# Patient Record
Sex: Female | Born: 1980
Health system: Southern US, Community
[De-identification: ages and names within clinical notes are randomized; demographics above are authoritative.]

## PROBLEM LIST (undated history)

## (undated) DIAGNOSIS — IMO0002 Reserved for concepts with insufficient information to code with codable children: Secondary | ICD-10-CM

## (undated) DIAGNOSIS — G43909 Migraine, unspecified, not intractable, without status migrainosus: Secondary | ICD-10-CM

## (undated) DIAGNOSIS — R87619 Unspecified abnormal cytological findings in specimens from cervix uteri: Secondary | ICD-10-CM

## (undated) HISTORY — DX: Migraine, unspecified, not intractable, without status migrainosus: G43.909

## (undated) HISTORY — DX: Reserved for concepts with insufficient information to code with codable children: IMO0002

## (undated) HISTORY — PX: KNEE SURGERY: SHX244

## (undated) HISTORY — DX: Unspecified abnormal cytological findings in specimens from cervix uteri: R87.619

---

## 2003-12-22 HISTORY — PX: COLPOSCOPY: SHX161

## 2005-05-03 ENCOUNTER — Emergency Department (HOSPITAL_COMMUNITY): Admission: EM | Admit: 2005-05-03 | Discharge: 2005-05-03 | Payer: Self-pay | Admitting: Emergency Medicine

## 2007-03-30 ENCOUNTER — Other Ambulatory Visit: Admission: RE | Admit: 2007-03-30 | Discharge: 2007-03-30 | Payer: Self-pay | Admitting: Obstetrics & Gynecology

## 2008-06-27 ENCOUNTER — Other Ambulatory Visit: Admission: RE | Admit: 2008-06-27 | Discharge: 2008-06-27 | Payer: Self-pay | Admitting: Obstetrics and Gynecology

## 2013-08-02 ENCOUNTER — Encounter: Payer: Self-pay | Admitting: Certified Nurse Midwife

## 2013-08-03 ENCOUNTER — Ambulatory Visit (INDEPENDENT_AMBULATORY_CARE_PROVIDER_SITE_OTHER): Payer: BC Managed Care – PPO | Admitting: Certified Nurse Midwife

## 2013-08-03 ENCOUNTER — Encounter: Payer: Self-pay | Admitting: Certified Nurse Midwife

## 2013-08-03 VITALS — BP 100/64 | HR 60 | Resp 16 | Ht 64.5 in | Wt 139.0 lb

## 2013-08-03 DIAGNOSIS — Z0000 Encounter for general adult medical examination without abnormal findings: Secondary | ICD-10-CM

## 2013-08-03 DIAGNOSIS — Z01419 Encounter for gynecological examination (general) (routine) without abnormal findings: Secondary | ICD-10-CM

## 2013-08-03 LAB — POCT URINALYSIS DIPSTICK
Blood, UA: NEGATIVE
Glucose, UA: NEGATIVE
Ketones, UA: NEGATIVE
Protein, UA: NEGATIVE
Urobilinogen, UA: NEGATIVE

## 2013-08-03 NOTE — Progress Notes (Signed)
Note reviewed, agree with plan.  Averie Hornbaker, MD  

## 2013-08-03 NOTE — Progress Notes (Signed)
32 y.o. [id number] Single Caucasian Fe here for annual exam. Periods normal. Contraception partner had vasectomy. No STD concerns or testing needed. Good year. Daughters now 7 and 9! No health issues today.  Patient's last menstrual period was 07/17/2013.          Sexually active: yes  The current method of family planning is vasectomy.    Exercising: yes  cardio & strength training Smoker:  no  Health Maintenance: Pap:  12/25/11 neg HPV HR neg MMG:  2/4/9 normal Colonoscopy:  none BMD:   none TDaP: 2010 Labs: Poct urine-neg, hgb-13.6 Self breast exam: done occ Abn. Pap 2004 with Colpo HPV + neg. colpo in 2005   reports that she has never smoked. She does not have any smokeless tobacco history on file. She reports that she drinks about 1.0 ounces of alcohol per week. She reports that she does not use illicit drugs.  Past Medical History  Diagnosis Date  . Abnormal Pap smear     ASCUS +HPV  . Migraines     Past Surgical History  Procedure Laterality Date  . Colposcopy  2005    neg  . Knee surgery Left     Current Outpatient Prescriptions  Medication Sig Dispense Refill  . EVENING PRIMROSE OIL PO Take by mouth daily. With omega 3      . Multiple Vitamins-Minerals (MULTIVITAMIN PO) Take by mouth daily.      Marland Kitchen UNABLE TO FIND daily. Med Name: 5HTP       No current facility-administered medications for this visit.    Family History  Problem Relation Age of Onset  . Cancer Mother     cervical  . Hyperlipidemia Mother   . Stroke Father   . Other Father     pulmonary fibrosis    ROS:  Pertinent items are noted in HPI.  Otherwise, a comprehensive ROS was negative.  Exam:   BP 100/64  Pulse 60  Resp 16  Ht 5' 4.5" (1.638 m)  Wt 139 lb (63.05 kg)  BMI 23.5 kg/m2  LMP 07/17/2013 Height: 5' 4.5" (163.8 cm)  Ht Readings from Last 3 Encounters:  08/03/13 5' 4.5" (1.638 m)    General appearance: alert, cooperative and appears stated age Head: Normocephalic, without  obvious abnormality, atraumatic Neck: no adenopathy, supple, symmetrical, trachea midline and thyroid normal to inspection and palpation Lungs: clear to auscultation bilaterally Breasts: normal appearance, no masses or tenderness, No nipple retraction or dimpling, No nipple discharge or bleeding, No axillary or supraclavicular adenopathy Heart: regular rate and rhythm Abdomen: soft, non-tender; no masses,  no organomegaly Extremities: extremities normal, atraumatic, no cyanosis or edema Skin: Skin color, texture, turgor normal. No rashes or lesions Lymph nodes: Cervical, supraclavicular, and axillary nodes normal. No abnormal inguinal nodes palpated Neurologic: Grossly normal   Pelvic: External genitalia:  no lesions              Urethra:  normal appearing urethra with no masses, tenderness or lesions              Bartholin's and Skene's: normal                 Vagina: normal appearing vagina with normal color and discharge, no lesions              Cervix: normal,non tender              Pap taken: yes Bimanual Exam:  Uterus:  normal size, contour, position, consistency, mobility, non-tender  and anteverted              Adnexa: normal adnexa and no mass, fullness, tenderness               Rectovaginal: Confirms               Anus:  normal sphincter tone, no lesions  A:  Well Woman with normal exam  Contraception partner vasectomy  History of abnormal pap( unsure date) with colposcopy just follow up pap? ( chart available today)  P:   Reviewed health and wellness pertinent to exam  Pap smear as per guidelines   pap smear taken today   counseled on breast self exam, adequate intake of calcium and vitamin D, diet and exercise  return annually or prn  An After Visit Summary was printed and given to the patient.

## 2013-08-03 NOTE — Patient Instructions (Signed)
General topics  Next pap or exam is  due in 1 year Take a Women's multivitamin Take 1200 mg. of calcium daily - prefer dietary If any concerns in interim to call back  Breast Self-Awareness Practicing breast self-awareness may pick up problems early, prevent significant medical complications, and possibly save your life. By practicing breast self-awareness, you can become familiar with how your breasts look and feel and if your breasts are changing. This allows you to notice changes early. It can also offer you some reassurance that your breast health is good. One way to learn what is normal for your breasts and whether your breasts are changing is to do a breast self-exam. If you find a lump or something that was not present in the past, it is best to contact your caregiver right away. Other findings that should be evaluated by your caregiver include nipple discharge, especially if it is bloody; skin changes or reddening; areas where the skin seems to be pulled in (retracted); or new lumps and bumps. Breast pain is seldom associated with cancer (malignancy), but should also be evaluated by a caregiver. BREAST SELF-EXAM The best time to examine your breasts is 5 7 days after your menstrual period is over.  ExitCare Patient Information 2013 [employer].   Exercise to Stay Healthy Exercise helps you become and stay healthy. EXERCISE IDEAS AND TIPS Choose exercises that:  You enjoy.  Fit into your day. You do not need to exercise really hard to be healthy. You can do exercises at a slow or medium level and stay healthy. You can:  Stretch before and after working out.  Try yoga, Pilates, or tai chi.  Lift weights.  Walk fast, swim, jog, run, climb stairs, bicycle, dance, or rollerskate.  Take aerobic classes. Exercises that burn about 150 calories:  Running 1  miles in 15 minutes.  Playing volleyball for 45 to 60 minutes.  Washing and waxing a car for 45 to 60  minutes.  Playing touch football for 45 minutes.  Walking 1  miles in 35 minutes.  Pushing a stroller 1  miles in 30 minutes.  Playing basketball for 30 minutes.  Raking leaves for 30 minutes.  Bicycling 5 miles in 30 minutes.  Walking 2 miles in 30 minutes.  Dancing for 30 minutes.  Shoveling snow for 15 minutes.  Swimming laps for 20 minutes.  Walking up stairs for 15 minutes.  Bicycling 4 miles in 15 minutes.  Gardening for 30 to 45 minutes.  Jumping rope for 15 minutes.  Washing windows or floors for 45 to 60 minutes. Document Released: 01/09/2011 Document Revised: 02/29/2012 Document Reviewed: 01/09/2011 Exit[employer] Patient Information 2013 [employer].   Other topics ( that may be useful information):    Sexually Transmitted Disease Sexually transmitted disease (STD) refers to any infection that is passed from person to person during sexual activity. This may happen by way of saliva, semen, blood, vaginal mucus, or urine. Common STDs include:  Gonorrhea.  Chlamydia.  Syphilis.  HIV/AIDS.  Genital herpes.  Hepatitis B and C.  Trichomonas.  Human papillomavirus (HPV).  Pubic lice. CAUSES  An STD may be spread by bacteria, virus, or parasite. A person can get an STD by:  Sexual intercourse with an infected person.  Sharing sex toys with an infected person.  Sharing needles with an infected person.  Having intimate contact with the genitals, mouth, or rectal areas of an infected person. SYMPTOMS  Some people may not have any symptoms, but   they can still pass the infection to others. Different STDs have different symptoms. Symptoms include:  Painful or bloody urination.  Pain in the pelvis, abdomen, vagina, anus, throat, or eyes.  Skin rash, itching, irritation, growths, or sores (lesions). These usually occur in the genital or anal area.  Abnormal vaginal discharge.  Penile discharge in men.  Soft, flesh-colored skin growths in the  genital or anal area.  Fever.  Pain or bleeding during sexual intercourse.  Swollen glands in the groin area.  Yellow skin and eyes (jaundice). This is seen with hepatitis. DIAGNOSIS  To make a diagnosis, your caregiver may:  Take a medical history.  Perform a physical exam.  Take a specimen (culture) to be examined.  Examine a sample of discharge under a microscope.  Perform blood test TREATMENT   Chlamydia, gonorrhea, trichomonas, and syphilis can be cured with antibiotic medicine.  Genital herpes, hepatitis, and HIV can be treated, but not cured, with prescribed medicines. The medicines will lessen the symptoms.  Genital warts from HPV can be treated with medicine or by freezing, burning (electrocautery), or surgery. Warts may come back.  HPV is a virus and cannot be cured with medicine or surgery.However, abnormal areas may be followed very closely by your caregiver and may be removed from the cervix, vagina, or vulva through office procedures or surgery. If your diagnosis is confirmed, your recent sexual partners need treatment. This is true even if they are symptom-free or have a negative culture or evaluation. They should not have sex until their caregiver says it is okay. HOME CARE INSTRUCTIONS  All sexual partners should be informed, tested, and treated for all STDs.  Take your antibiotics as directed. Finish them even if you start to feel better.  Only take over-the-counter or prescription medicines for pain, discomfort, or fever as directed by your caregiver.  Rest.  Eat a balanced diet and drink enough fluids to keep your urine clear or pale yellow.  Do not have sex until treatment is completed and you have followed up with your caregiver. STDs should be checked after treatment.  Keep all follow-up appointments, Pap tests, and blood tests as directed by your caregiver.  Only use latex condoms and water-soluble lubricants during sexual activity. Do not use  petroleum jelly or oils.  Avoid alcohol and illegal drugs.  Get vaccinated for HPV and hepatitis. If you have not received these vaccines in the past, talk to your caregiver about whether one or both might be right for you.  Avoid risky sex practices that can break the skin. The only way to avoid getting an STD is to avoid all sexual activity.Latex condoms and dental dams (for oral sex) will help lessen the risk of getting an STD, but will not completely eliminate the risk. SEEK MEDICAL CARE IF:   You have a fever.  You have any new or worsening symptoms. Document Released: 02/27/2003 Document Revised: 02/29/2012 Document Reviewed: 03/06/2011 ExitCare Patient Information 2013 [employer].    Domestic Abuse You are being battered or abused if someone close to you hits, pushes, or physically hurts you in any way. You also are being abused if you are forced into activities. You are being sexually abused if you are forced to have sexual contact of any kind. You are being emotionally abused if you are made to feel worthless or if you are constantly threatened. It is important to remember that help is available. No one has the right to abuse you. PREVENTION OF FURTHER   ABUSE  Learn the warning signs of danger. This varies with situations but may include: the use of alcohol, threats, isolation from friends and family, or forced sexual contact. Leave if you feel that violence is going to occur.  If you are attacked or beaten, report it to the police so the abuse is documented. You do not have to press charges. The police can protect you while you or the attackers are leaving. Get the officer's name and badge number and a copy of the report.  Find someone you can trust and tell them what is happening to you: your caregiver, a nurse, clergy member, close friend or family member. Feeling ashamed is natural, but remember that you have done nothing wrong. No one deserves abuse. Document Released:  12/04/2000 Document Revised: 02/29/2012 Document Reviewed: 02/12/2011 Exit[employer] Patient Information 2013 [employer].    How Much is Too Much Alcohol? Drinking too much alcohol can cause injury, accidents, and health problems. These types of problems can include:   Car crashes.  Falls.  Family fighting (domestic violence).  Drowning.  Fights.  Injuries.  Burns.  Damage to certain organs.  Having a baby with birth defects. ONE DRINK CAN BE TOO MUCH WHEN YOU ARE:  Working.  Pregnant or breastfeeding.  Taking medicines. Ask your doctor.  Driving or planning to drive. If you or someone you know has a drinking problem, get help from a doctor.  Document Released: 10/03/2009 Document Revised: 02/29/2012 Document Reviewed: 10/03/2009 ExitCare Patient Information 2013 [employer].   Smoking Hazards Smoking cigarettes is extremely bad for your health. Tobacco smoke has over 200 known poisons in it. There are over 60 chemicals in tobacco smoke that cause cancer. Some of the chemicals found in cigarette smoke include:   Cyanide.  Benzene.  Formaldehyde.  Methanol (wood alcohol).  Acetylene (fuel used in welding torches).  Ammonia. Cigarette smoke also contains the poisonous gases nitrogen oxide and carbon monoxide.  Cigarette smokers have an increased risk of many serious medical problems and Smoking causes approximately:  90% of all lung cancer deaths in men.  80% of all lung cancer deaths in women.  90% of deaths from chronic obstructive lung disease. Compared with nonsmokers, smoking increases the risk of:  Coronary heart disease by 2 to 4 times.  Stroke by 2 to 4 times.  Men developing lung cancer by 23 times.  Women developing lung cancer by 13 times.  Dying from chronic obstructive lung diseases by 12 times.  . Smoking is the most preventable cause of death and disease in our society.  WHY IS SMOKING ADDICTIVE?  Nicotine is the chemical  agent in tobacco that is capable of causing addiction or dependence.  When you smoke and inhale, nicotine is absorbed rapidly into the bloodstream through your lungs. Nicotine absorbed through the lungs is capable of creating a powerful addiction. Both inhaled and non-inhaled nicotine may be addictive.  Addiction studies of cigarettes and spit tobacco show that addiction to nicotine occurs mainly during the teen years, when young people begin using tobacco products. WHAT ARE THE BENEFITS OF QUITTING?  There are many health benefits to quitting smoking.   Likelihood of developing cancer and heart disease decreases. Health improvements are seen almost immediately.  Blood pressure, pulse rate, and breathing patterns start returning to normal soon after quitting. QUITTING SMOKING   American Lung Association - 1-800-LUNGUSA  American Cancer Society - 1-800-ACS-2345 Document Released: 01/14/2005 Document Revised: 02/29/2012 Document Reviewed: 09/18/2009 ExitCare Patient Information 2013 [employer].   Stress Management Stress is a state of physical or mental tension that often results from changes in your life or normal routine. Some common causes of stress are:  Death of a loved one.  Injuries or severe illnesses.  Getting fired or changing jobs.  Moving into a new home. Other causes may be:  Sexual problems.  Business or financial losses.  Taking on a large debt.  Regular conflict with someone at home or at work.  Constant tiredness from lack of sleep. It is not just bad things that are stressful. It may be stressful to:  Win the lottery.  Get married.  Buy a new car. The amount of stress that can be easily tolerated varies from person to person. Changes generally cause stress, regardless of the types of change. Too much stress can affect your health. It may lead to physical or emotional problems. Too little stress (boredom) may also become stressful. SUGGESTIONS TO  REDUCE STRESS:  Talk things over with your family and friends. It often is helpful to share your concerns and worries. If you feel your problem is serious, you may want to get help from a professional counselor.  Consider your problems one at a time instead of lumping them all together. Trying to take care of everything at once may seem impossible. List all the things you need to do and then start with the most important one. Set a goal to accomplish 2 or 3 things each day. If you expect to do too many in a single day you will naturally fail, causing you to feel even more stressed.  Do not use alcohol or drugs to relieve stress. Although you may feel better for a short time, they do not remove the problems that caused the stress. They can also be habit forming.  Exercise regularly - at least 3 times per week. Physical exercise can help to relieve that "uptight" feeling and will relax you.  The shortest distance between despair and hope is often a good night's sleep.  Go to bed and get up on time allowing yourself time for appointments without being rushed.  Take a short "time-out" period from any stressful situation that occurs during the day. Close your eyes and take some deep breaths. Starting with the muscles in your face, tense them, hold it for a few seconds, then relax. Repeat this with the muscles in your neck, shoulders, hand, stomach, back and legs.  Take good care of yourself. Eat a balanced diet and get plenty of rest.  Schedule time for having fun. Take a break from your daily routine to relax. HOME CARE INSTRUCTIONS   Call if you feel overwhelmed by your problems and feel you can no longer manage them on your own.  Return immediately if you feel like hurting yourself or someone else. Document Released: 06/02/2001 Document Revised: 02/29/2012 Document Reviewed: 01/23/2008 Exit[employer] Patient Information 2013 [employer].   

## 2013-08-04 LAB — HEMOGLOBIN, FINGERSTICK: Hemoglobin, fingerstick: 13.6 g/dL (ref 12.0–16.0)

## 2013-08-08 LAB — IPS PAP TEST WITH REFLEX TO HPV

## 2014-08-03 ENCOUNTER — Telehealth: Payer: Self-pay | Admitting: Certified Nurse Midwife

## 2014-08-03 ENCOUNTER — Ambulatory Visit: Payer: BC Managed Care – PPO | Admitting: Certified Nurse Midwife

## 2014-08-03 NOTE — Telephone Encounter (Signed)
Patient called said she was not going to be able to leave work so she had to cancel her appt at 2:45. Rescheduled pt to 08/08/14 at 1:00 pm. Knows to be here at 12:45 and knows she will be charged for [employer]

## 2014-08-08 ENCOUNTER — Ambulatory Visit (INDEPENDENT_AMBULATORY_CARE_PROVIDER_SITE_OTHER): Payer: BC Managed Care – PPO | Admitting: Certified Nurse Midwife

## 2014-08-08 ENCOUNTER — Encounter: Payer: Self-pay | Admitting: Certified Nurse Midwife

## 2014-08-08 VITALS — BP 100/68 | HR 68 | Ht 65.0 in | Wt 142.0 lb

## 2014-08-08 DIAGNOSIS — Z01419 Encounter for gynecological examination (general) (routine) without abnormal findings: Secondary | ICD-10-CM

## 2014-08-08 DIAGNOSIS — Z0000 Encounter for general adult medical examination without abnormal findings: Secondary | ICD-10-CM

## 2014-08-08 LAB — POCT URINALYSIS DIPSTICK
Bilirubin, UA: NEGATIVE
Blood, UA: NEGATIVE
Glucose, UA: NEGATIVE
KETONES UA: NEGATIVE
LEUKOCYTES UA: NEGATIVE
Nitrite, UA: NEGATIVE
PH UA: 7
PROTEIN UA: NEGATIVE
Urobilinogen, UA: NEGATIVE

## 2014-08-08 NOTE — Patient Instructions (Signed)
EXERCISE AND DIET:  We recommended that you start or continue a regular exercise program for good health. Regular exercise means any activity that makes your heart beat faster and makes you sweat.  We recommend exercising at least 30 minutes per day at least 3 days a week, preferably 4 or 5.  We also recommend a diet low in fat and sugar.  Inactivity, poor dietary choices and obesity can cause diabetes, heart attack, stroke, and kidney damage, among others.    ALCOHOL AND SMOKING:  Women should limit their alcohol intake to no more than 7 drinks/beers/glasses of wine (combined, not each!) per week. Moderation of alcohol intake to this level decreases your risk of breast cancer and liver damage. And of course, no recreational drugs are part of a healthy lifestyle.  And absolutely no smoking or even second hand smoke. Most people know smoking can cause heart and lung diseases, but did you know it also contributes to weakening of your bones? Aging of your skin?  Yellowing of your teeth and nails?  CALCIUM AND VITAMIN D:  Adequate intake of calcium and Vitamin D are recommended.  The recommendations for exact amounts of these supplements seem to change often, but generally speaking 600 mg of calcium (either carbonate or citrate) and 800 units of Vitamin D per day seems prudent. Certain women may benefit from higher intake of Vitamin D.  If you are among these women, your doctor will have told you during your visit.    PAP SMEARS:  Pap smears, to check for cervical cancer or precancers,  have traditionally been done yearly, although recent scientific advances have shown that most women can have pap smears less often.  However, every woman still should have a physical exam from her gynecologist every year. It will include a breast check, inspection of the vulva and vagina to check for abnormal growths or skin changes, a visual exam of the cervix, and then an exam to evaluate the size and shape of the uterus and  ovaries.  And after 35 years of age, a rectal exam is indicated to check for rectal cancers. We will also provide age appropriate advice regarding health maintenance, like when you should have certain vaccines, screening for sexually transmitted diseases, bone density testing, colonoscopy, mammograms, etc.   MAMMOGRAMS:  All women over 40 years old should have a yearly mammogram. Many facilities now offer a "3D" mammogram, which may cost around $50 extra out of pocket. If possible,  we recommend you accept the option to have the 3D mammogram performed.  It both reduces the number of women who will be called back for extra views which then turn out to be normal, and it is better than the routine mammogram at detecting truly abnormal areas.    COLONOSCOPY:  Colonoscopy to screen for colon cancer is recommended for all women at age 50.  We know, you hate the idea of the prep.  We agree, BUT, having colon cancer and not knowing it is worse!!  Colon cancer so often starts as a polyp that can be seen and removed at colonscopy, which can quite literally save your life!  And if your first colonoscopy is normal and you have no family history of colon cancer, most women don't have to have it again for 10 years.  Once every ten years, you can do something that may end up saving your life, right?  We will be happy to help you get it scheduled when you are ready.    Be sure to check your insurance coverage so you understand how much it will cost.  It may be covered as a preventative service at no cost, but you should check your particular policy.     Premenstrual Syndrome Premenstrual syndrome (PMS) is a condition that consists of physical, emotional, and behavioral symptoms that affect women of childbearing age. PMS occurs 5-14 days before the start of a menstrual period and often recurs in a predictable pattern. The symptoms go away a few days after the menstrual period starts. PMS can interfere in many ways with normal  daily activities and can range from mild to severe. When PMS is considered severe, it may be diagnosed as premenstrual dysphoric disorder (PMDD). A small percentage of women are affected by PMS symptoms and an even smaller percentage of those women are affected by PMDD.  CAUSES  The exact cause of PMS is unknown, but it seems to be related to cyclic hormone changes that happen before menstruation. These hormones are thought to affect chemicals in the brain (serotonin) that can influence a person's mood.  SYMPTOMS  Symptoms of PMS recur consistently from month to month and go away completely after the menstrual period starts. The most common emotional or behavioral symptom is mood swings. These mood swings can be disabling and interfere with normal activities of daily living. Other common symptoms include depression and angry outbursts. Other symptoms may include:   Irritability.  Anxiety.  Crying spells.   Food cravings or appetite changes.   Changes in sexual desire.   Confusion.   Aggression.   Social withdrawal.   Poor concentration. The most common physical symptoms include a sense of bloating, breast pain, headaches, and extreme fatigue. Other physical symptoms include:   Backaches.   Swelling of the hands and feet.   Weight gain.   Hot flashes.  DIAGNOSIS  To make a diagnosis, your caregiver will ask questions to confirm that you are having a pattern of symptoms. Symptoms must:   Be present 5 days before the start of your period and be present at least 3 months in a row.   End within 4 days after your period starts.   Interfere with some of your normal activities.  Other conditions, such as thyroid disease, depression, and migraine headaches must be ruled out before a diagnosis of PMS is confirmed.  TREATMENT  Your caregiver may suggest ways to maintain a healthy lifestyle, such as exercise. Over-the-counter pain relievers may ease cramps, aches, pains,  headaches, and breast tenderness. However, selective serotonin reuptake inhibitors (SSRIs) are medicines that are most beneficial in improving PMS if taken in the second half of the monthly cycle. They may be taken on a daily basis. The most effective oral contraceptive pill used for symptoms of PMS is one that contains the ingredient drospirenone. Taking 4 days off of the pill instead of the usual 7 days also has shown to increase effectiveness.  There are a number of drugs, dietary supplements, vitamins, and water pills (diuretics) which have been suggested to be helpful but have not shown to be of any benefit to improving PMS symptoms.  HOME CARE INSTRUCTIONS   For 2-3 months, write down your symptoms, their severity, and how long they last. This may help your caregiver prescribe the best treatment for your symptoms.  Exercise regularly as suggested by your caregiver.  Eat a regular, well-balanced diet.  Avoid caffeine, alcohol, and tobacco consumption.  Limit salt and salty foods to lessen bloating and fluid  retention.  Get enough sleep. Practice relaxation techniques.  Drink enough fluids to keep your urine clear or pale yellow.  Take medicines as directed by your caregiver.  Limit stress.  Take a multivitamin as directed by your caregiver. Document Released: 12/04/2000 Document Revised: 08/31/2012 Document Reviewed: 04/25/2012 [facility] Patient Information 2015 [employer], [place]. This information is not intended to replace advice given to you by your health care provider. Make sure you discuss any questions you have with your health care provider.  Great to see you today! Take care Kathryn [name]

## 2014-08-08 NOTE — Progress Notes (Signed)
33 y.o. [id number] Married Caucasian Fe here for annual exam. Periods normal, but cramping and PMS has increased some. Patient using Evening primrose with some relief and Aleve with good response for cramping.Luberta Robertson. Sees PCP prn. No other health issues today. Girls are 8,10 now!!  Patient's last menstrual period was 07/17/2014.          Sexually active: Yes.    The current method of family planning is vasectomy.    Exercising: Yes.    Home exercise routine includes cardio and strength training. Smoker:  yes  Health Maintenance: Pap:  08/03/13 Neg HR HPV MMG:  never Colonoscopy:  never BMD:   never TDaP:  2010 Labs: refused     UA:neg ph: 7.0   reports that she has never smoked. She does not have any smokeless tobacco history on file. She reports that she drinks about one ounce of alcohol per week. She reports that she does not use illicit drugs.  Past Medical History  Diagnosis Date  . Abnormal Pap smear     ASCUS +HPV  . Migraines     Past Surgical History  Procedure Laterality Date  . Colposcopy  2005    neg  . Knee surgery Left     Current Outpatient Prescriptions  Medication Sig Dispense Refill  . EVENING PRIMROSE OIL PO Take by mouth daily. With omega 3      . Multiple Vitamins-Minerals (MULTIVITAMIN PO) Take by mouth daily.      Marland Kitchen. UNABLE TO FIND daily. Med Name: 5HTP       No current facility-administered medications for this visit.    Family History  Problem Relation Age of Onset  . Cancer Mother     cervical  . Hyperlipidemia Mother   . Stroke Father   . Other Father     pulmonary fibrosis    ROS:  Pertinent items are noted in HPI.  Otherwise, a comprehensive ROS was negative.  Exam:   BP 100/68  Pulse 68  Ht 5\' 5"  (1.651 m)  Wt 142 lb (64.411 kg)  BMI 23.63 kg/m2  LMP 07/17/2014 Height: 5\' 5"  (165.1 cm)  Ht Readings from Last 3 Encounters:  08/08/14 5\' 5"  (1.651 m)  08/03/13 5' 4.5" (1.638 m)    General appearance: alert, cooperative and appears stated  age Head: Normocephalic, without obvious abnormality, atraumatic Neck: no adenopathy, supple, symmetrical, trachea midline and thyroid normal to inspection and palpation Lungs: clear to auscultation bilaterally Breasts: normal appearance, no masses or tenderness, No nipple retraction or dimpling, No nipple discharge or bleeding, No axillary or supraclavicular adenopathy Heart: regular rate and rhythm Abdomen: soft, non-tender; no masses,  no organomegaly Extremities: extremities normal, atraumatic, no cyanosis or edema Skin: Skin color, texture, turgor normal. No rashes or lesions Lymph nodes: Cervical, supraclavicular, and axillary nodes normal. No abnormal inguinal nodes palpated Neurologic: Grossly normal   Pelvic: External genitalia:  no lesions              Urethra:  normal appearing urethra with no masses, tenderness or lesions              Bartholin's and Skene's: normal                 Vagina: normal appearing vagina with normal color and discharge, no lesions              Cervix: normal, non tender, no lesions              Pap  taken: No. Bimanual Exam:  Uterus:  normal size, contour, position, consistency, mobility, non-tender and anteverted              Adnexa: normal adnexa and no mass, fullness, tenderness               Rectovaginal: Confirms               Anus:  normal sphincter tone, no lesions  A:  Well Woman with normal exam  PMS increase  P:   Reviewed health and wellness pertinent to exam  Discussed OCP use for cycle control to consider if PMS and dysmenorrhea continues. Patient will advise if she needs help with.  Pap smear not taken today   counseled on breast self exam, adequate intake of calcium and vitamin D, diet and exercise  return annually or prn  An After Visit Summary was printed and given to the patient.

## 2014-08-09 NOTE — Progress Notes (Signed)
Reviewed personally.  M. Suzanne Drezden Seitzinger, MD.  

## 2014-09-19 ENCOUNTER — Telehealth: Payer: Self-pay | Admitting: Certified Nurse Midwife

## 2014-09-19 ENCOUNTER — Other Ambulatory Visit: Payer: Self-pay | Admitting: Certified Nurse Midwife

## 2014-09-19 DIAGNOSIS — Z309 Encounter for contraceptive management, unspecified: Secondary | ICD-10-CM

## 2014-09-19 MED ORDER — NORETHIN ACE-ETH ESTRAD-FE 1-20 MG-MCG(24) PO TABS: 1.0000 | ORAL_TABLET | Freq: Every day | ORAL | Status: DC

## 2014-09-19 NOTE — Telephone Encounter (Signed)
Patient is calling to let Verner Choleborah S. Leonard CNM know she would like to start OCP at this time. Per OV note patient was to call and advise if she would like to start.

## 2014-09-19 NOTE — Telephone Encounter (Signed)
Patient can start on Loestrin 24 Fe on first day of next period. Needs OV 3 months  For evaluation of use. Please schedule. Order in

## 2014-09-19 NOTE — Telephone Encounter (Signed)
Pt is requesting a rx for birth control. Pt just had aex a few weeks ago and was told to just call when she wanted the medication.

## 2014-09-20 NOTE — Telephone Encounter (Signed)
Spoke with patient. Advised of message as seen below from Verner Choleborah S. Leonard CNM. Patient is agreeable and verbalizes understanding. Patient started cycle this past Sunday. Would like to know what to do since it has been "bad" and that is why she is wanting to get on the OCP. Advised can start this coming Sunday but will need BUM for one month. Patient states that her husband had a vasectomy so that will not be a problem. Three month follow up scheduled for 12/3 at 9:15am with Verner Choleborah S. Leonard CNM. Patient agreeable to date and time.  Routing to provider for final review. Patient agreeable to disposition. Will close encounter

## 2014-10-17 ENCOUNTER — Telehealth: Payer: Self-pay | Admitting: Certified Nurse Midwife

## 2014-10-17 NOTE — Telephone Encounter (Signed)
Attempted to reach patient at number provided [phone] recording states that number is no longer in service. Tried to reach patient at [phone] phone rang and rang with no answer. Will attempt to reach patient again later.

## 2014-10-17 NOTE — Telephone Encounter (Signed)
Pt has questions regarding her bc

## 2014-10-22 ENCOUNTER — Encounter: Payer: Self-pay | Admitting: Certified Nurse Midwife

## 2014-10-22 NOTE — Telephone Encounter (Signed)
Spoke with patient. She has completed her first month of Loestrin 24 FE. Patient reports a very heavy and painful cycle that started on 10/26 and lasted through 10/16/14. Patient states it was more painful and heavier than it ever has been. Patient reports passing large fleshy clots as well. States that these symptoms have resolved as of 10/17/14. Patient denies any chance of pregnancy, husband with vasectomy. Patient feels her cycles are getting heavier with age. Wondering if symptoms are related to fibroids or a cyst or prior prolapsed uterus. She is wondering if Verner Choleborah S. Leonard CNM recommends staying on Loestrin. Patient denies complaints at this time. Declines to schedule office visit at this time. Would like Verner Choleborah S. Leonard to review and advise, if feels she needs office visit she will come in. Advised Debbi out of office today, but would return her call with message. Patient agreeable.

## 2014-10-22 NOTE — Telephone Encounter (Signed)
If she is adjusting to OCP with second pack this may resolve. She can also take Motrin 800 mg at onset on period which should help. If not resolving will need OV.

## 2014-10-23 NOTE — Telephone Encounter (Signed)
Spoke with patient. Advised patient of message as seen below from Deborah S. Leonard CNM. Patient is agreeable and verbalizes understanding.   Routing to provider for final review. Patient agreeable to disposition. Will close encounter   

## 2014-11-22 ENCOUNTER — Encounter: Payer: Self-pay | Admitting: Certified Nurse Midwife

## 2014-11-22 ENCOUNTER — Ambulatory Visit (INDEPENDENT_AMBULATORY_CARE_PROVIDER_SITE_OTHER): Payer: BC Managed Care – PPO | Admitting: Certified Nurse Midwife

## 2014-11-22 VITALS — BP 104/62 | HR 68 | Resp 16 | Ht 65.0 in | Wt 146.0 lb

## 2014-11-22 DIAGNOSIS — Z3041 Encounter for surveillance of contraceptive pills: Secondary | ICD-10-CM

## 2014-11-22 NOTE — Progress Notes (Signed)
33 y.o. Married Caucasian G2 P2002 here for evaluation of Lo Estrin 24 Fe initiated on 08/08/14 for Dysmenorrhea and menorrhagia. First period on  OCP was heavy. Second period no nausea and no cramping and lighter flow.. Menses duration 3 days with moderate flow only first cycle, 3 days and light on second !Marland Kitchen.. Patient taking medication as prescribed. Denies missed pills, headaches, nausea, DVT warning signs or symptoms,  breakthrough bleeding, or other changes.  Keeping menses calendar. Patient is very happy with choice. She also is now not experiencing ovulation pain. No other health issues today  O: Healthy female, WD WN Affect: normal orientation X 3    A: History of Dysmenorrhea with menorrhagia  using Lo Estrin 24 Fe with good results.  P: Continue OCP as prescribed, has Rx coverage until aex. Questions addressed regarding when to start care for daughter [id number]. Given recommendation of no visit  Needed with GYN unless issues with menses. First Pap at 19. Should continue regular care with pediatrician. Given information on Gardasil .  20 minutes spent with patient  face to face counseling regarding OCP surveillance and information regarding adolescent gyn care.  RV prn aex

## 2014-11-22 NOTE — Patient Instructions (Signed)

## 2014-11-28 NOTE — Progress Notes (Signed)
Reviewed personally.  M. Suzanne Shermaine Brigham, MD.  

## 2015-01-30 ENCOUNTER — Telehealth: Payer: Self-pay | Admitting: Certified Nurse Midwife

## 2015-01-30 NOTE — Telephone Encounter (Signed)
Spoke with patient. Patient states that she is now on her fourth pack of Loestrin 24 fe. Patient is in third week of pack and started bleeding on Sunday. "It is a spotting but is increasing." Patient denies missing any pills but has taken a pill late this month. "I also had bleeding during sex on Sunday which was weird since I was not supposed to start my period. I have been having some lower back pain." Denies any urinary symptoms. Advised patient will need to be seen for evaluation with Verner Choleborah S. Leonard CNM. Patient is agreeable. Appointment scheduled for tomorrow at 2:30pm. Agreeable to date and time.  Routing to provider for final review. Patient agreeable to disposition. Will close encounter

## 2015-01-30 NOTE — Telephone Encounter (Signed)
Left message to call Kaitlyn at [phone]. 

## 2015-01-30 NOTE — Telephone Encounter (Signed)
Patient wants to talk with a nurse. She states she has some questions no information given.

## 2015-01-31 ENCOUNTER — Ambulatory Visit (INDEPENDENT_AMBULATORY_CARE_PROVIDER_SITE_OTHER): Payer: BLUE CROSS/BLUE SHIELD | Admitting: Certified Nurse Midwife

## 2015-01-31 ENCOUNTER — Encounter: Payer: Self-pay | Admitting: Certified Nurse Midwife

## 2015-01-31 VITALS — BP 110/70 | HR 70 | Resp 16 | Ht 65.0 in | Wt 147.0 lb

## 2015-01-31 DIAGNOSIS — N926 Irregular menstruation, unspecified: Secondary | ICD-10-CM

## 2015-01-31 DIAGNOSIS — Z113 Encounter for screening for infections with a predominantly sexual mode of transmission: Secondary | ICD-10-CM

## 2015-01-31 LAB — POCT URINE PREGNANCY: Preg Test, Ur: NEGATIVE

## 2015-01-31 NOTE — Progress Notes (Signed)
34 y.o. married(separated) white female g2p2002 here with complaint of bleeding on OCP with onset of sexual activity with new partner. Sexual activity 2 weeks ago and again in the past week and noted bleeding similar to period with slight cramping, but darker blood. Consistent OCP without problems since onset of use. Periods regular, no issues until now. Did not use condoms, because this was someone she knew. Spouse left with no idea where he is for the past year. Patient has tried to have him attend counseling, but he refused. She is counseling now. Desires just Gc, Chlamydia today and will consider blood work later. New partner was larger than spouse and had pain with sexual activity. Denies vaginal odor or tearing that she is aware of. Patient on last 2 days of third week of OCP, Loestrin 24 FE.  O:Healthy female WDWN Affect:crying while discussing the concern, orientation x 3  Exam: Abdomen:soft, non tender Lymph node: no enlargement or tenderness Pelvic exam: External genital: normal female, no lesions or lacerations noted BUS: negative Vagina: brown blood appearance discharge noted. Affirm taken, Specimen collected, no lacerations noted or lesions Cervix: normal, non tender, small amount of blood noted from cervix, negative CMT Uterus: normal, non tender Adnexa:normal, non tender, no masses or fullness noted   A:Normal pelvic exam BTB on Loestrin 24 Fe with sexual activity STD screening Affirm Social stress with spouse missing   P:Discussed findings of normal pelvic exam. Discussed that bleeding can occur from just BTB with OCP use. Discussed importance of STD screening to rule out other concerns and encouraged to do serum screening in one month. Patient agreeable. Discussed no lacerations noted and no odor to brown discharge. Encouraged to use condoms if continues with sexual activity, patient does not plan to continue relationship. Complete OCP pack as usual and restart as usual. If  BTB occurs again needs to advise, may need OCP change or further evaluation. Warning signs of bleeding given. Lab: GC,Chlamydia Discussed importance of counseling for her and seeking family and friends support. Patient aware.  Rv prn

## 2015-01-31 NOTE — Patient Instructions (Signed)
Dysfunctional Uterine Bleeding Normally, menstrual periods begin between ages 11 to 17 in young women. A normal menstrual cycle/period may begin every 23 days up to 35 days and lasts from 1 to 7 days. Around 12 to 14 days before your menstrual period starts, ovulation (ovary produces an egg) occurs. When counting the time between menstrual periods, count from the first day of bleeding of the previous period to the first day of bleeding of the next period. Dysfunctional (abnormal) uterine bleeding is bleeding that is different from a normal menstrual period. Your periods may come earlier or later than usual. They may be lighter, have blood clots or be heavier. You may have bleeding between periods, or you may skip one period or more. You may have bleeding after sexual intercourse, bleeding after menopause, or no menstrual period. CAUSES   Pregnancy (normal, miscarriage, tubal).  IUDs (intrauterine device, birth control).  Birth control pills.  Hormone treatment.  Menopause.  Infection of the cervix.  Blood clotting problems.  Infection of the inside lining of the uterus.  Endometriosis, inside lining of the uterus growing in the pelvis and other female organs.  Adhesions (scar tissue) inside the uterus.  Obesity or severe weight loss.  Uterine polyps inside the uterus.  Cancer of the vagina, cervix, or uterus.  Ovarian cysts or polycystic ovary syndrome.  Medical problems (diabetes, thyroid disease).  Uterine fibroids (noncancerous tumor).  Problems with your female hormones.  Endometrial hyperplasia, very thick lining and enlarged cells inside of the uterus.  Medicines that interfere with ovulation.  Radiation to the pelvis or abdomen.  Chemotherapy. DIAGNOSIS   Your doctor will discuss the history of your menstrual periods, medicines you are taking, changes in your weight, stress in your life, and any medical problems you may have.  Your doctor will do a physical  and pelvic examination.  Your doctor may want to perform certain tests to make a diagnosis, such as:  Pap test.  Blood tests.  Cultures for infection.  CT scan.  Ultrasound.  Hysteroscopy.  Laparoscopy.  MRI.  Hysterosalpingography.  D and C.  Endometrial biopsy. TREATMENT  Treatment will depend on the cause of the dysfunctional uterine bleeding (DUB). Treatment may include:  Observing your menstrual periods for a couple of months.  Prescribing medicines for medical problems, including:  Antibiotics.  Hormones.  Birth control pills.  Removing an IUD (intrauterine device, birth control).  Surgery:  D and C (scrape and remove tissue from inside the uterus).  Laparoscopy (examine inside the abdomen with a lighted tube).  Uterine ablation (destroy lining of the uterus with electrical current, laser, heat, or freezing).  Hysteroscopy (examine cervix and uterus with a lighted tube).  Hysterectomy (remove the uterus). HOME CARE INSTRUCTIONS   If medicines were prescribed, take exactly as directed. Do not change or switch medicines without consulting your caregiver.  Long term heavy bleeding may result in iron deficiency. Your caregiver may have prescribed iron pills. They help replace the iron that your body lost from heavy bleeding. Take exactly as directed.  Do not take aspirin or medicines that contain aspirin one week before or during your menstrual period. Aspirin may make the bleeding worse.  If you need to change your sanitary pad or tampon more than once every 2 hours, stay in bed with your feet elevated and a cold pack on your lower abdomen. Rest as much as possible, until the bleeding stops or slows down.  Eat well-balanced meals. Eat foods high in iron. Examples   are:  Leafy green vegetables.  Whole-grain breads and cereals.  Eggs.  Meat.  Liver.  Do not try to lose weight until the abnormal bleeding has stopped and your blood iron level is  back to normal. Do not lift more than ten pounds or do strenuous activities when you are bleeding.  For a couple of months, make note on your calendar, marking the start and ending of your period, and the type of bleeding (light, medium, heavy, spotting, clots or missed periods). This is for your caregiver to better evaluate your problem. SEEK MEDICAL CARE IF:   You develop nausea (feeling sick to your stomach) and vomiting, dizziness, or diarrhea while you are taking your medicine.  You are getting lightheaded or weak.  You have any problems that may be related to the medicine you are taking.  You develop pain with your DUB.  You want to remove your IUD.  You want to stop or change your birth control pills or hormones.  You have any type of abnormal bleeding mentioned above.  You are over 16 years old and have not had a menstrual period yet.  You are 34 years old and you are still having menstrual periods.  You have any of the symptoms mentioned above.  You develop a rash. SEEK IMMEDIATE MEDICAL CARE IF:   An oral temperature above 102 F (38.9 C) develops.  You develop chills.  You are changing your sanitary pad or tampon more than once an hour.  You develop abdominal pain.  You pass out or faint. Document Released: 12/04/2000 Document Revised: 02/29/2012 Document Reviewed: 11/05/2009 Exit[employer] Patient Information 2015 [employer]. This information is not intended to replace advice given to you by your health care provider. Make sure you discuss any questions you have with your health care provider.  

## 2015-02-01 LAB — WET PREP BY MOLECULAR PROBE
CANDIDA SPECIES: NEGATIVE
Gardnerella vaginalis: NEGATIVE
Trichomonas vaginosis: NEGATIVE

## 2015-02-01 NOTE — Progress Notes (Signed)
Reviewed personally.  M. Suzanne Gloriana Piltz, MD.  

## 2015-02-02 LAB — IPS N GONORRHOEA AND CHLAMYDIA BY PCR

## 2015-04-03 ENCOUNTER — Telehealth: Payer: Self-pay | Admitting: Certified Nurse Midwife

## 2015-04-03 NOTE — Telephone Encounter (Signed)
agree

## 2015-04-03 NOTE — Telephone Encounter (Signed)
Pt states she was here 2 months ago with cycle issues - advised to call with updates. Pt has not had her cycle this month. Unsure if she should make appointment.

## 2015-04-03 NOTE — Telephone Encounter (Signed)
Spoke with patient. She states she is on 4th iron pill in her pack of Loestrin 24 FE and has not started a cycle. She states last month was very light with spotting only. This month, has not had a cycle during Iron pills. Advised patient normal to have very light or no periods on Loestrin 24 FE. Patient denies missed pills. Patient denies partner change since last visit but is sexually active, denies STD concerns. Patient declines appointment for evaluation at this time.  Patient is advised to take OTC pregnancy test and call back with results. Advised would send message to provider to obtain any further instructions. Patient agreeable.

## 2015-08-08 ENCOUNTER — Ambulatory Visit: Payer: BLUE CROSS/BLUE SHIELD | Admitting: Certified Nurse Midwife

## 2015-08-21 ENCOUNTER — Encounter: Payer: Self-pay | Admitting: Certified Nurse Midwife

## 2015-08-21 ENCOUNTER — Ambulatory Visit (INDEPENDENT_AMBULATORY_CARE_PROVIDER_SITE_OTHER): Payer: BLUE CROSS/BLUE SHIELD | Admitting: Certified Nurse Midwife

## 2015-08-21 VITALS — BP 100/60 | HR 68 | Resp 16 | Ht 65.0 in | Wt 141.0 lb

## 2015-08-21 DIAGNOSIS — Z01419 Encounter for gynecological examination (general) (routine) without abnormal findings: Secondary | ICD-10-CM

## 2015-08-21 DIAGNOSIS — Z124 Encounter for screening for malignant neoplasm of cervix: Secondary | ICD-10-CM | POA: Diagnosis not present

## 2015-08-21 DIAGNOSIS — Z0000 Encounter for general adult medical examination without abnormal findings: Secondary | ICD-10-CM

## 2015-08-21 LAB — POCT URINALYSIS DIPSTICK
Bilirubin, UA: NEGATIVE
Blood, UA: NEGATIVE
Glucose, UA: NEGATIVE
Ketones, UA: NEGATIVE
LEUKOCYTES UA: NEGATIVE
Nitrite, UA: NEGATIVE
PH UA: 5
PROTEIN UA: NEGATIVE
Urobilinogen, UA: NEGATIVE

## 2015-08-21 NOTE — Progress Notes (Signed)
34 y.o. [id number] Married  Caucasian Fe here for annual exam. Periods normal through 3/16, and since that time has had some BTB with OCP's. Considering stopping OCP due to this. Not sexually active at present. No STD screening needed. Sees PCP for migraine management and had labs/aex, all labs normal per patient. Currently on herbal supplement for migraines which is helping. Spouse trying to resolve issues, continues to be separated. Patient coping well with daughters 22, 69. No other health issues today.  Patient's last menstrual period was 07/22/2015.          Sexually active: Yes.    The current method of family planning is OCP (estrogen/progesterone) & husband vasectomy   Exercising: Yes.    cardio Smoker:  no  Health Maintenance: Pap:  08-03-13 neg MMG:  2012? Colonoscopy:  none BMD:   none TDaP:  2010 Labs: poct urine-neg Self breast exam: done occ   reports that she has never smoked. She does not have any smokeless tobacco history on file. She reports that she drinks about 1.2 oz of alcohol per week. She reports that she does not use illicit drugs.  Past Medical History  Diagnosis Date  . Abnormal Pap smear     ASCUS +HPV  . Migraines     Past Surgical History  Procedure Laterality Date  . Colposcopy  2005    neg  . Knee surgery Left     Current Outpatient Prescriptions  Medication Sig Dispense Refill  . Multiple Vitamins-Minerals (MULTIVITAMIN PO) Take by mouth daily.    . Norethindrone Acetate-Ethinyl Estrad-FE (LOESTRIN 24 FE) 1-20 MG-MCG(24) tablet Take 1 tablet by mouth daily. 1 Package 12  . UNABLE TO FIND daily. Herbal supplement. Stress care     No current facility-administered medications for this visit.    Family History  Problem Relation Age of Onset  . Cancer Mother     cervical  . Hyperlipidemia Mother   . Stroke Father   . Other Father     pulmonary fibrosis    ROS:  Pertinent items are noted in HPI.  Otherwise, a comprehensive ROS was  negative.  Exam:   BP 100/60 mmHg  Pulse 68  Resp 16  Ht  (1.651 m)  Wt 141 lb (63.957 kg)  BMI 23.46 kg/m2  LMP 07/22/2015 Height:  (165.1 cm) Ht Readings from Last 3 Encounters:  08/21/15  (1.651 m)  01/31/15  (1.651 m)  11/22/14  (1.651 m)    General appearance: alert, cooperative and appears stated age Head: Normocephalic, without obvious abnormality, atraumatic Neck: no adenopathy, supple, symmetrical, trachea midline and thyroid normal to inspection and palpation Lungs: clear to auscultation bilaterally Breasts: normal appearance, no masses or tenderness, No nipple retraction or dimpling, No nipple discharge or bleeding, No axillary or supraclavicular adenopathy Heart: regular rate and rhythm Abdomen: soft, non-tender; no masses,  no organomegaly Extremities: extremities normal, atraumatic, no cyanosis or edema Skin: Skin color, texture, turgor normal. No rashes or lesions Lymph nodes: Cervical, supraclavicular, and axillary nodes normal. No abnormal inguinal nodes palpated Neurologic: Grossly normal   Pelvic: External genitalia:  no lesions              Urethra:  normal appearing urethra with no masses, tenderness or lesions              Bartholin's and Skene's: normal                 Vagina: normal appearing vagina  with normal color and discharge, no lesions              Cervix: normal              Pap taken: Yes.   Bimanual Exam:  Uterus:  normal size, contour, position, consistency, mobility, non-tender and anteverted              Adnexa: normal adnexa and no mass, fullness, tenderness               Rectovaginal: Confirms               Anus:  normal sphincter tone, no lesions  Chaperone present: yes  A:  Well Woman with normal exam  Contraception none desired now  Migraine headaches with PCP management,no aura  Social stress with separation   P:   Reviewed health and wellness pertinent to exam  Will advise if desires  again  Continue follow up with MD as indicated  Pap smear as above with HPVHR   counseled on breast self exam, adequate intake of calcium and vitamin D, diet and exercise  return annually or prn  An After Visit Summary was printed and given to the patient.

## 2015-08-21 NOTE — Progress Notes (Signed)
Reviewed personally.  M. Suzanne Emylia Latella, MD.  

## 2015-08-21 NOTE — Patient Instructions (Signed)

## 2015-08-23 LAB — IPS PAP TEST WITH HPV

## 2015-08-30 ENCOUNTER — Other Ambulatory Visit: Payer: Self-pay | Admitting: Certified Nurse Midwife

## 2015-09-02 NOTE — Telephone Encounter (Signed)
Medication refill request: Gildess Fe Last AEX:  08/21/15 with DL Next AEX: 12/26/08 with DL Last MMG (if hormonal medication request):  Refill authorized: please advise

## 2016-03-13 ENCOUNTER — Telehealth: Payer: Self-pay | Admitting: Certified Nurse Midwife

## 2016-03-13 NOTE — Telephone Encounter (Signed)
Patient states she is having a lot of pain and would like to discuss this with DL. She states they have discussed having an ultrasound done before and wants to know if this is possible to have done now.

## 2016-03-13 NOTE — Telephone Encounter (Signed)
Spoke with patient. She states that she has had L Sided "pain for a long time" and has previously discussed this with Leota Sauerseborah Leonard CNM. She states she was started on OCP and this helped but she has since stopped taking the OCP.  Patient feels like this may be pain related to her ovary and is ready to follow up now as she did not have ultrasound as previously recommended by Debbi.  LMP approximately 3 weeks ago (Unsure of date). Cycles are regular. No irregular vaginal bleeding or heavy bleeding. Patient has not tried any OTC medications to help with pain. Able to eat and drink as normal. At work today. She is offered appointment today and declines due to her work schedule. Office visit with Leota Sauerseborah Leonard CNM scheduled for 03/17/16. Patient advised needs office visit prior to planning ultrasound and she is agreeable.   Advised if any changes to condition or pain increased to please call back or seek care at local urgent care or emergency room. Patient agreeable.  Routing to provider for final review. Patient agreeable to disposition. Will close encounter.

## 2016-03-17 ENCOUNTER — Encounter: Payer: Self-pay | Admitting: Certified Nurse Midwife

## 2016-03-17 ENCOUNTER — Ambulatory Visit (INDEPENDENT_AMBULATORY_CARE_PROVIDER_SITE_OTHER): Payer: Commercial Managed Care - HMO | Admitting: Certified Nurse Midwife

## 2016-03-17 VITALS — BP 110/66 | HR 74 | Temp 98.1°F | Resp 16 | Ht 65.0 in | Wt 151.0 lb

## 2016-03-17 DIAGNOSIS — N912 Amenorrhea, unspecified: Secondary | ICD-10-CM

## 2016-03-17 DIAGNOSIS — N949 Unspecified condition associated with female genital organs and menstrual cycle: Secondary | ICD-10-CM | POA: Diagnosis not present

## 2016-03-17 DIAGNOSIS — Z113 Encounter for screening for infections with a predominantly sexual mode of transmission: Secondary | ICD-10-CM

## 2016-03-17 DIAGNOSIS — R102 Pelvic and perineal pain: Secondary | ICD-10-CM

## 2016-03-17 DIAGNOSIS — N852 Hypertrophy of uterus: Secondary | ICD-10-CM

## 2016-03-17 LAB — CBC WITH DIFFERENTIAL/PLATELET
Basophils Absolute: 0 10*3/uL (ref 0.0–0.1)
Basophils Relative: 0 % (ref 0–1)
Eosinophils Absolute: 0.3 10*3/uL (ref 0.0–0.7)
Eosinophils Relative: 4 % (ref 0–5)
HCT: 40.1 % (ref 36.0–46.0)
HEMOGLOBIN: 13.5 g/dL (ref 12.0–15.0)
LYMPHS ABS: 2.7 10*3/uL (ref 0.7–4.0)
Lymphocytes Relative: 38 % (ref 12–46)
MCH: 30.3 pg (ref 26.0–34.0)
MCHC: 33.7 g/dL (ref 30.0–36.0)
MCV: 89.9 fL (ref 78.0–100.0)
MONOS PCT: 8 % (ref 3–12)
MPV: 12.1 fL (ref 8.6–12.4)
Monocytes Absolute: 0.6 10*3/uL (ref 0.1–1.0)
NEUTROS ABS: 3.6 10*3/uL (ref 1.7–7.7)
NEUTROS PCT: 50 % (ref 43–77)
Platelets: 229 10*3/uL (ref 150–400)
RBC: 4.46 MIL/uL (ref 3.87–5.11)
RDW: 13 % (ref 11.5–15.5)
WBC: 7.1 10*3/uL (ref 4.0–10.5)

## 2016-03-17 LAB — POCT URINALYSIS DIPSTICK
Bilirubin, UA: NEGATIVE
Blood, UA: NEGATIVE
Glucose, UA: NEGATIVE
Ketones, UA: NEGATIVE
LEUKOCYTES UA: NEGATIVE
Nitrite, UA: NEGATIVE
PH UA: 5
PROTEIN UA: NEGATIVE
UROBILINOGEN UA: NEGATIVE

## 2016-03-17 LAB — POCT URINE PREGNANCY: PREG TEST UR: NEGATIVE

## 2016-03-17 NOTE — Patient Instructions (Signed)

## 2016-03-17 NOTE — Progress Notes (Signed)
35 y.o. divorced  female  [id number] here for complaint of pelvic pain. Pain in left pelvic area has increased over the past 2-3 months. Patient had ovulatory pain which OCP use had seemed to help with in addition to spotting. Was recommended here to have PUS here, but declined at that time.  Pain is primarily located low left quadrant pain and is described as intermittent and has to stop and breathe through it now. It  has definitely changed..  Pain is aggravated by lifting and is associated with no other symptoms. No pain with sexual activity, but has not had sexual activity since has increased intensity..  She has tried the following treatments Advil, but no change with no improvement in pain.Has tried heat and ice. Denies any nausea or vomiting or diarrhea with occurrence. Exercise does not change the intensity when occurs. Patient had a TAB with oral medication in 12/16 with heavy bleeding and normal follow up. Using condoms and had condom breakage and use plan B in the past month. No other concerns today.  ROS:    Fever:  No.  Weight loss/gain:  Yes 10 lbs.  Vaginal discharge or odor:  no  Other:  NO urinary symptoms  All other ROS questions are negative except as per HPI.  Exam:   UPT negative BP 110/66 mmHg  Pulse 74  Temp(Src) 98.1 F (36.7 C) (Oral)  Resp 16  Ht 5\' 5"  (1.651 m)  Wt 151 lb (68.493 kg)  BMI 25.13 kg/m2  LMP 02/12/2016   General appearance: alert, cooperative, appears stated age and no distress CV:  Heart NSSR no murmurs Lungs:clear to auscultation bilaterally Abdomen:  normal bowel sounds, non tender in all quadrants except left lower quadrant, no rebound. generalized tenderness ing the left area. No severe pain with palpation noted. Lymph:  inguinal lymph nodes not tender or enlarged   Pelvic: External genitalia:  no lesions and normal escutcheon              Urethra: normal appearing urethra with no masses, tenderness or lesions              Bartholins and Skenes:  Bartholin's, Urethra, Skene's normal                 Vagina: normal appearing vagina with normal color and discharge, no lesions, affirm taken and GC/Chlamydia screen taken              Cervix: normal appearance and no CMT noted, no lesions              Pap taken: No. Bimanual Exam:  Uterus:  Slightly enlarged non tender, tilts to left                               Adnexa:    normal adnexa in size, nontender and no masses and fullness noted left with slight tenderness noted                               Rectovaginal: Confirms                               Anus:  normal sphincter tone, no lesions    A: Normal pelvic exam      Enlarged uterus with LLQ fullness and tenderness  STD screening      History of ovulatory pain with OCP use for results      Contraception needed, condoms not working well  P: Reviewed with patient normal exam      Discussed pelvic findings and need to evaluate as previously recommended with pain and new finding of enlarged uterus with LLQ fullness. Discussed possible etiology of cyst, fibroid. Patient agreeable to PUS.Patient will be called with insurance infor and scheduled. Warning signs with pelvic pain given and need to be seen in OV or ER if pain is severe. Can use Motrin 800 mg every 8 hours if needed for discomfort. Stressed contraception with better coverage needed. Patient would consider OCP again. Will decide after PUS. Questions addressed.  Labs:  CBC with diff. Enid Skeens     Radiology:  Order in for PUS       Rv as above.       An After Visit Summary was printed and given to the patient.

## 2016-03-18 ENCOUNTER — Telehealth: Payer: Self-pay | Admitting: Certified Nurse Midwife

## 2016-03-18 LAB — WET PREP BY MOLECULAR PROBE
Candida species: NEGATIVE
Gardnerella vaginalis: NEGATIVE
Trichomonas vaginosis: NEGATIVE

## 2016-03-18 NOTE — Telephone Encounter (Signed)
Left message to call Kaitlyn at [phone]. 

## 2016-03-18 NOTE — Telephone Encounter (Signed)
Spoke with pt regarding benefit for [employer]. Patient understood and agreeable. Patient scheduled 03/19/16 with Dr Hyacinth MeekerMiller. Pt aware of arrival date and time. Pt aware of 72 hours cancellation policy with $100 fee. No further questions. Ok to close

## 2016-03-18 NOTE — Telephone Encounter (Signed)
Spoke with patient to convey benefits. Patient mentioned she is in a lot more pain today than yesterday and is also nauseated. Patient states she's not sure if these symptoms are a result of the exam yesterday, but she wanted to convey this information to her provider.  Routing to Clinical Triage for reveiw

## 2016-03-18 NOTE — Progress Notes (Signed)
Encounter reviewed Emalee Knies, MD   

## 2016-03-19 ENCOUNTER — Ambulatory Visit (INDEPENDENT_AMBULATORY_CARE_PROVIDER_SITE_OTHER): Payer: Commercial Managed Care - HMO

## 2016-03-19 ENCOUNTER — Other Ambulatory Visit: Payer: Self-pay

## 2016-03-19 ENCOUNTER — Ambulatory Visit (INDEPENDENT_AMBULATORY_CARE_PROVIDER_SITE_OTHER): Payer: Commercial Managed Care - HMO | Admitting: Obstetrics & Gynecology

## 2016-03-19 VITALS — BP 120/70 | HR 80 | Resp 16 | Ht 65.0 in | Wt 150.0 lb

## 2016-03-19 DIAGNOSIS — N852 Hypertrophy of uterus: Secondary | ICD-10-CM

## 2016-03-19 DIAGNOSIS — G43009 Migraine without aura, not intractable, without status migrainosus: Secondary | ICD-10-CM | POA: Diagnosis not present

## 2016-03-19 DIAGNOSIS — N9489 Other specified conditions associated with female genital organs and menstrual cycle: Secondary | ICD-10-CM

## 2016-03-19 DIAGNOSIS — N949 Unspecified condition associated with female genital organs and menstrual cycle: Secondary | ICD-10-CM | POA: Diagnosis not present

## 2016-03-19 DIAGNOSIS — R102 Pelvic and perineal pain: Secondary | ICD-10-CM

## 2016-03-19 LAB — IPS N GONORRHOEA AND CHLAMYDIA BY PCR

## 2016-03-19 MED ORDER — NORETHINDRONE ACET-ETHINYL EST 1.5-30 MG-MCG PO TABS: 1.0000 | ORAL_TABLET | Freq: Every day | ORAL | Status: DC

## 2016-03-19 NOTE — Progress Notes (Signed)
35 y.o. [id number] Married Caucasian female here for pelvic ultrasound due to increased pelvic pain that changed when she stopped combination OCPs last fall.  She stopped her pills due to irregular bleeding.  Since that time, pain before her cycle is significantly worse.  She is taking OTC products without much success.  Cycles are much worse, in regards to flow, and actually her pain improves some once her bleeding begins.    This is bothersome enough that she really wants some answers.  She is not using anything for contraception but has not been SA "much" since stopping her OCP.  She denies SA since 02/12/16 LMP.    Patient's last menstrual period was 02/12/2016.  Contraception: none  Findings:  UTERUS: 8.5 x 5.0 x 3.8cm EMS: 10.414mm ADNEXA: Left ovary: 3.5 x 2.0 x 1.8cm       Right ovary: 3 x 1.9 x 2.2cm CUL DE SAC: no free fluid  Discussion:  D/w pt causes of pelvic pain including I.C, IBS, ovarian cysts, musculoskeletal issues, and endometriosis.  Based on symptoms and change after stopping OCP, feel endometriosis is most likely cause. Treatments with OCPs, depo Lupron, laparoscopy with possible cauterization of lesions, Mirena IUD use all discussed.  Pt is most interested in laparoscopy.  She is not sure what she wants to do at this time.  Knowing costs would be helpful.  Will precert this for her.  Assessment:  Pelvic pain, worse since stopping OCPs Migraines without aura  Plan:  Pt will restart OCPs.  Rx for Loestrin 1.5/30 to pharmacy.  Cycles due anytime and she has no risk of pregnancy.  She will begin rx as soon as she gets it.  Risks of DVT/PE, stroke, MI, hypertension, nausea reviewed.   Ca-125 today.   Pt understands weaknesses of test and how this can help with diagnosis Considering laparoscopy (and would want BTL or salpingectomy) vs Mirena IUD.  ~30 minutes spent with patient >50% of time was in face to face discussion of above.

## 2016-03-20 LAB — CA 125: CA 125: 16 U/mL (ref <35)

## 2016-03-21 ENCOUNTER — Encounter: Payer: Self-pay | Admitting: Obstetrics & Gynecology

## 2016-03-21 DIAGNOSIS — R102 Pelvic and perineal pain: Secondary | ICD-10-CM | POA: Insufficient documentation

## 2016-03-21 DIAGNOSIS — G43009 Migraine without aura, not intractable, without status migrainosus: Secondary | ICD-10-CM | POA: Insufficient documentation

## 2016-03-24 NOTE — Telephone Encounter (Signed)
Patient was seen on 03/19/2016 for further evaluation with Dr.Miller. Please see OV note. Will close encounter.

## 2016-03-26 ENCOUNTER — Telehealth: Payer: Self-pay | Admitting: Obstetrics & Gynecology

## 2016-03-26 NOTE — Telephone Encounter (Signed)
Called patient to review benefits for a recommended procedure. Left Voicemail requesting a call back. °

## 2016-03-27 NOTE — Telephone Encounter (Signed)
Called patient to review benefits for a recommended surgical procedure. Left Voicemail requesting a call back. °

## 2016-03-30 NOTE — Telephone Encounter (Signed)
Spoke with patient regarding benefits for recommended surgical procedures. Pt agreeable, but wanted to verify benefits with her secondary insurance and contact the pre-service center. I have left a message for patient to contact me in order to convey benefit information for secondary carrier

## 2016-03-31 ENCOUNTER — Other Ambulatory Visit: Payer: Self-pay | Admitting: Family Medicine

## 2016-03-31 DIAGNOSIS — R1032 Left lower quadrant pain: Secondary | ICD-10-CM

## 2016-04-01 ENCOUNTER — Ambulatory Visit
Admission: RE | Admit: 2016-04-01 | Discharge: 2016-04-01 | Disposition: A | Discharge status: Home / Self Care | Payer: Commercial Managed Care - HMO | Source: Ambulatory Visit | Attending: Family Medicine | Admitting: Family Medicine

## 2016-04-01 ENCOUNTER — Other Ambulatory Visit: Payer: Self-pay

## 2016-04-01 DIAGNOSIS — R1032 Left lower quadrant pain: Secondary | ICD-10-CM

## 2016-04-01 MED ORDER — IOPAMIDOL (ISOVUE-300) INJECTION 61%
100.0000 mL | Freq: Once | INTRAVENOUS | Status: AC | PRN
  Administered 2016-04-01: 100 mL via INTRAVENOUS

## 2016-04-13 ENCOUNTER — Telehealth: Payer: Self-pay | Admitting: Certified Nurse Midwife

## 2016-04-13 NOTE — Telephone Encounter (Signed)
Spoke with pt regarding benefit for surgery. Patient understood and agreeable. Patient ready to schedule. Patient provided surgery deposit over the phone. Patient is aware this is professional benefit only. Patient aware she will be contacted by hospital for separate benefits. Patient has advised she does not want to proceed with the sterilization procedure, but does want to proceed with the diagnositc recommended . Routing message to Kennon RoundsSally to review and for scheduling

## 2016-04-13 NOTE — Telephone Encounter (Signed)
Left message to call Blake Goya at [phone]. 

## 2016-04-13 NOTE — Telephone Encounter (Signed)
Patient is asking to talk with Sara Chuebbie Leonard, CNM about her procedure. Patient is aware that a nurse will call her back.

## 2016-04-14 NOTE — Telephone Encounter (Signed)
Call to patient. Discussed surgery date options and patient prefers to proceed with first available of 04-21-16.  Patient confirms she does not want sterilization procedure.  Surgery instruction sheet reviewed and printed copy will be mailed. See copy scanned to chart.  Routing to provider for final review. Patient agreeable to disposition. Will close encounter.    Routing to Dr Oscar LaJertson who will assist with surgery, while Dr Hyacinth MeekerMiller out of office.

## 2016-04-16 NOTE — Patient Instructions (Signed)
Your procedure is scheduled on:  Tuesday, Apr 21, 2016  [facility]  7[facility]15 AM  Pick up the phone at the desk and dial [phone].  Call this number if you have problems the morning of surgery: [phone].  Remember: Do NOT eat food or drink after:  Midnight Tonight  Take these medicines the morning of surgery with a SIP OF WATER:  None  Do NOT wear jewelry (body piercing), metal hair clips/bobby pins, make-up, or nail polish. Do NOT wear lotions, powders, or perfumes.  You may wear deodorant. Do NOT shave for 48 hours prior to surgery. Do NOT bring valuables to the hospital. Contacts, dentures, or bridgework may not be worn into surgery.  Have a responsible adult drive you home and stay with you for 24 hours after your procedure

## 2016-04-16 NOTE — Telephone Encounter (Signed)
Left message to call Kaitlyn at [phone]. 

## 2016-04-20 ENCOUNTER — Encounter: Payer: Self-pay | Admitting: Obstetrics & Gynecology

## 2016-04-20 ENCOUNTER — Ambulatory Visit (INDEPENDENT_AMBULATORY_CARE_PROVIDER_SITE_OTHER): Payer: Commercial Managed Care - HMO | Admitting: Obstetrics & Gynecology

## 2016-04-20 ENCOUNTER — Encounter (HOSPITAL_COMMUNITY)
Admission: RE | Admit: 2016-04-20 | Discharge: 2016-04-20 | Disposition: A | Discharge status: Home / Self Care | Payer: Commercial Managed Care - HMO | Source: Ambulatory Visit | Attending: Obstetrics & Gynecology | Admitting: Obstetrics & Gynecology

## 2016-04-20 ENCOUNTER — Encounter (HOSPITAL_COMMUNITY): Payer: Self-pay

## 2016-04-20 VITALS — BP 102/60 | HR 70 | Resp 16 | Ht 65.0 in | Wt 147.0 lb

## 2016-04-20 DIAGNOSIS — R1032 Left lower quadrant pain: Secondary | ICD-10-CM | POA: Diagnosis not present

## 2016-04-20 DIAGNOSIS — N831 Corpus luteum cyst of ovary, unspecified side: Secondary | ICD-10-CM | POA: Diagnosis not present

## 2016-04-20 DIAGNOSIS — R102 Pelvic and perineal pain: Secondary | ICD-10-CM | POA: Diagnosis not present

## 2016-04-20 LAB — CBC
HEMATOCRIT: 37.5 % (ref 36.0–46.0)
Hemoglobin: 12.8 g/dL (ref 12.0–15.0)
MCH: 30 pg (ref 26.0–34.0)
MCHC: 34.1 g/dL (ref 30.0–36.0)
MCV: 87.8 fL (ref 78.0–100.0)
Platelets: 238 10*3/uL (ref 150–400)
RBC: 4.27 MIL/uL (ref 3.87–5.11)
RDW: 13.2 % (ref 11.5–15.5)
WBC: 5.3 10*3/uL (ref 4.0–10.5)

## 2016-04-20 MED ORDER — HYDROCODONE-ACETAMINOPHEN 5-325 MG PO TABS: 1.0000 | ORAL_TABLET | Freq: Four times a day (QID) | ORAL | Status: DC | PRN

## 2016-04-20 MED ORDER — TRAMADOL HCL 50 MG PO TABS: ORAL_TABLET | ORAL | Status: DC

## 2016-04-20 NOTE — Telephone Encounter (Signed)
Leota Sauerseborah Leonard CNM, I have attempted to reach this patient x2 with no return call. Okay to close encounter?

## 2016-04-20 NOTE — Telephone Encounter (Signed)
It looks like she has an appt. Today.  Ok to close

## 2016-04-20 NOTE — Progress Notes (Signed)
35 y.o. [id number] MarriedCaucasian female here with LLQ pain who has decided she wants to proceed with diagnostic laparoscopy and possible treatment of endometriosis.  Pt was seen 03/19/16 for PUS.  Options for pain evaluation/treatment discussed then.  Pt has decided to proceed with laparoscopy.  She called and was scheduled while I was out of the office so she is here for discussion of this decision today.  At last discussion, she was also considering BTL/salpingectomy.  She informs me today that she has decided she does not want to proceed with this portion of the procedure, although she does not want other children.  Pt does not want to elaborate on her decision.  Procedure with possible findings discussed.  Location of ports discussed.  Risks and benefits reviewed including but not limited to  bleeding, <1% risk of receiving a  transfusion, infection, rare risk of bowel/bladder/ureteral/vascular injury discussed as well as possible need for additional surgery if injury does occur discussed.  DVT/PE and rare risk of death discussed.  My actual complications with prior surgeries discussed.  Possibility of finding nothing vs finding endometriosis discussed.  Laser ablation of endometriosis also discussed.  Pt reports if left ovary has significant findings, she would like this removed.  Consent will be modified tomorrow morning before procedure.  Pt aware if endometriosis present, biopsy will also be performed.  All questions answered.    Pt did start OCPs last month after being seen 03/19/16.  Pt reported after started the OCPs, she started having headaches after two weeks.  She denies migraines but feels the headaches have "leveled out" so she does not want to stop OCPs at this time.  POPs discussed and possible use discussed if no endometriosis is found.  Pt open to changing method of birth control.  Ob Hx:   Patient's last menstrual period was 04/12/2016.          Sexually active: No. Birth control: oral  contraceptives (estrogen/progesterone) Last pap: 08/21/15 Neg. HR HPV:neg Last MMG: None Tobacco: No  Past Surgical History  Procedure Laterality Date  . Colposcopy  2005    neg  . Knee surgery Left     Past Medical History  Diagnosis Date  . Abnormal Pap smear     ASCUS +HPV  . Migraines     Allergies: Review of patient's allergies indicates no known allergies.  Current Outpatient Prescriptions  Medication Sig Dispense Refill  . Norethindrone Acetate-Ethinyl Estradiol (LOESTRIN 1.5/30, 21,) 1.5-30 MG-MCG tablet Take 1 tablet by mouth daily. 3 Package 3  . SUMAtriptan (IMITREX) 50 MG tablet Take 50 mg by mouth every 2 (two) hours as needed for migraine. May repeat in 2 hours if headache persists or recurs.    Marland Kitchen acetaminophen (TYLENOL) 500 MG tablet Take 1,000 mg by mouth every 6 (six) hours as needed for headache. Reported on 04/20/2016    . b complex vitamins tablet Take 1 tablet by mouth daily. Reported on 04/20/2016    . ibuprofen (ADVIL,MOTRIN) 200 MG tablet Take 600 mg by mouth every 6 (six) hours as needed for headache or moderate pain. Reported on 04/20/2016    . loratadine (CLARITIN) 10 MG tablet Take 10 mg by mouth daily as needed for allergies. Reported on 04/20/2016    . Multiple Vitamins-Minerals (MULTIVITAMIN PO) Take by mouth daily. Reported on 04/20/2016    . UNABLE TO FIND daily. Reported on 04/20/2016    . UNABLE TO FIND daily. Reported on 04/20/2016     No current facility-administered  medications for this visit.   ROS: Pertinent items noted in HPI and remainder of comprehensive ROS otherwise negative.  Exam:    BP 102/60 mmHg  Pulse 70  Resp 16  Ht 5\' 5"  (1.651 m)  Wt 147 lb (66.679 kg)  BMI 24.46 kg/m2  LMP 04/12/2016  General appearance: alert and cooperative Head: Normocephalic, without obvious abnormality, atraumatic Neck: no adenopathy, supple, symmetrical, trachea midline and thyroid not enlarged, symmetric, no tenderness/mass/nodules Lungs: clear to  auscultation bilaterally Heart: regular rate and rhythm, S1, S2 normal, no murmur, click, rub or gallop Abdomen: soft, non-tender; bowel sounds normal; no masses,  no organomegaly Extremities: extremities normal, atraumatic, no cyanosis or edema Skin: Skin color, texture, turgor normal. No rashes or lesions Lymph nodes: Cervical, supraclavicular, and axillary nodes normal. no inguinal nodes palpated Neurologic: Grossly normal  Pelvic: External genitalia:  no lesions              Urethra: normal appearing urethra with no masses, tenderness or lesions              Bartholins and Skenes: Bartholin's, Urethra, Skene's normal                 Vagina: normal appearing vagina with normal color and discharge, no lesions              Cervix: normal appearance              Pap taken: No.        Bimanual Exam:  Uterus:  uterus is normal size, shape, consistency and nontender                                      Adnexa:    normal adnexa in size, nontender and no masses                                      Anus:  normal sphincter tone, no lesions  A: LLQ pain, possible endometriosis     P:  Diagnostic laparoscopy with possible treatment of endometriosis and possible LSO planned Rx for Tramadol given. If laparoscopy is negative, will plan GI and ortho evaluation.  Pt in agreement with plan.  ~30 minutes spent with patient >50% of time was in face to face discussion of above.

## 2016-04-21 ENCOUNTER — Ambulatory Visit (HOSPITAL_COMMUNITY)
Admission: RE | Admit: 2016-04-21 | Discharge: 2016-04-21 | Disposition: A | Discharge status: Home / Self Care | Payer: Commercial Managed Care - HMO | Source: Ambulatory Visit | Attending: Obstetrics & Gynecology | Admitting: Obstetrics & Gynecology

## 2016-04-21 ENCOUNTER — Ambulatory Visit (HOSPITAL_COMMUNITY): Payer: Commercial Managed Care - HMO | Admitting: Anesthesiology

## 2016-04-21 ENCOUNTER — Encounter: Payer: Self-pay | Admitting: Obstetrics & Gynecology

## 2016-04-21 ENCOUNTER — Encounter (HOSPITAL_COMMUNITY)
Admission: RE | Disposition: A | Discharge status: Home / Self Care | Payer: Self-pay | Source: Ambulatory Visit | Attending: Obstetrics & Gynecology

## 2016-04-21 ENCOUNTER — Encounter (HOSPITAL_COMMUNITY): Payer: Self-pay

## 2016-04-21 DIAGNOSIS — R1032 Left lower quadrant pain: Secondary | ICD-10-CM

## 2016-04-21 DIAGNOSIS — R102 Pelvic and perineal pain: Secondary | ICD-10-CM | POA: Insufficient documentation

## 2016-04-21 DIAGNOSIS — N831 Corpus luteum cyst of ovary, unspecified side: Secondary | ICD-10-CM | POA: Insufficient documentation

## 2016-04-21 HISTORY — PX: LYSIS OF ADHESION: SHX5961

## 2016-04-21 HISTORY — PX: LAPAROSCOPY: SHX197

## 2016-04-21 LAB — PREGNANCY, URINE: Preg Test, Ur: NEGATIVE

## 2016-04-21 SURGERY — LAPAROSCOPY, DIAGNOSTIC
Anesthesia: General | Site: Abdomen

## 2016-04-21 MED ORDER — FENTANYL CITRATE (PF) 100 MCG/2ML IJ SOLN
INTRAMUSCULAR | Status: AC
  Filled 2016-04-21: qty 2

## 2016-04-21 MED ORDER — SCOPOLAMINE 1 MG/3DAYS TD PT72
MEDICATED_PATCH | TRANSDERMAL | Status: AC
  Administered 2016-04-21: 1.5 mg via TRANSDERMAL
  Filled 2016-04-21: qty 1

## 2016-04-21 MED ORDER — MIDAZOLAM HCL 2 MG/2ML IJ SOLN
INTRAMUSCULAR | Status: AC
  Filled 2016-04-21: qty 2

## 2016-04-21 MED ORDER — KETOROLAC TROMETHAMINE 30 MG/ML IJ SOLN
INTRAMUSCULAR | Status: AC
  Filled 2016-04-21: qty 1

## 2016-04-21 MED ORDER — KETOROLAC TROMETHAMINE 30 MG/ML IJ SOLN: 30.0000 mg | Freq: Once | INTRAMUSCULAR | Status: DC

## 2016-04-21 MED ORDER — BUPIVACAINE HCL (PF) 0.25 % IJ SOLN
INTRAMUSCULAR | Status: DC | PRN
  Administered 2016-04-21: 4 mL

## 2016-04-21 MED ORDER — LACTATED RINGERS IV SOLN
INTRAVENOUS | Status: DC
  Administered 2016-04-21: 07:00:00 via INTRAVENOUS

## 2016-04-21 MED ORDER — DEXAMETHASONE SODIUM PHOSPHATE 4 MG/ML IJ SOLN
INTRAMUSCULAR | Status: AC
  Filled 2016-04-21: qty 1

## 2016-04-21 MED ORDER — MEPERIDINE HCL 25 MG/ML IJ SOLN
INTRAMUSCULAR | Status: AC
  Filled 2016-04-21: qty 1

## 2016-04-21 MED ORDER — FENTANYL CITRATE (PF) 100 MCG/2ML IJ SOLN
INTRAMUSCULAR | Status: DC | PRN
  Administered 2016-04-21 (×3): 50 ug via INTRAVENOUS

## 2016-04-21 MED ORDER — ONDANSETRON HCL 4 MG/2ML IJ SOLN: 4.0000 mg | Freq: Once | INTRAMUSCULAR | Status: DC | PRN

## 2016-04-21 MED ORDER — BUPIVACAINE HCL (PF) 0.25 % IJ SOLN
INTRAMUSCULAR | Status: AC
  Filled 2016-04-21: qty 30

## 2016-04-21 MED ORDER — SODIUM CHLORIDE 0.9 % IJ SOLN
INTRAMUSCULAR | Status: AC
  Filled 2016-04-21: qty 10

## 2016-04-21 MED ORDER — KETOROLAC TROMETHAMINE 30 MG/ML IJ SOLN
INTRAMUSCULAR | Status: DC | PRN
Start: 2016-04-21 — End: 2016-04-21
  Administered 2016-04-21: 30 mg via INTRAVENOUS

## 2016-04-21 MED ORDER — SUGAMMADEX SODIUM 500 MG/5ML IV SOLN
INTRAVENOUS | Status: AC
  Filled 2016-04-21: qty 5

## 2016-04-21 MED ORDER — MEPERIDINE HCL 25 MG/ML IJ SOLN
6.2500 mg | INTRAMUSCULAR | Status: DC | PRN
  Administered 2016-04-21: 12.5 mg via INTRAVENOUS

## 2016-04-21 MED ORDER — ROCURONIUM BROMIDE 100 MG/10ML IV SOLN
INTRAVENOUS | Status: AC
  Filled 2016-04-21: qty 1

## 2016-04-21 MED ORDER — ONDANSETRON HCL 4 MG/2ML IJ SOLN
INTRAMUSCULAR | Status: DC | PRN
  Administered 2016-04-21: 4 mg via INTRAVENOUS

## 2016-04-21 MED ORDER — MIDAZOLAM HCL 2 MG/2ML IJ SOLN
INTRAMUSCULAR | Status: DC | PRN
  Administered 2016-04-21: 2 mg via INTRAVENOUS

## 2016-04-21 MED ORDER — SUCCINYLCHOLINE CHLORIDE 20 MG/ML IJ SOLN
INTRAMUSCULAR | Status: AC
  Filled 2016-04-21: qty 1

## 2016-04-21 MED ORDER — FENTANYL CITRATE (PF) 250 MCG/5ML IJ SOLN
INTRAMUSCULAR | Status: AC
  Filled 2016-04-21: qty 5

## 2016-04-21 MED ORDER — DEXAMETHASONE SODIUM PHOSPHATE 10 MG/ML IJ SOLN
INTRAMUSCULAR | Status: DC | PRN
  Administered 2016-04-21: 4 mg via INTRAVENOUS

## 2016-04-21 MED ORDER — SCOPOLAMINE 1 MG/3DAYS TD PT72
1.0000 | MEDICATED_PATCH | Freq: Once | TRANSDERMAL | Status: DC
  Administered 2016-04-21: 1.5 mg via TRANSDERMAL

## 2016-04-21 MED ORDER — LIDOCAINE HCL (CARDIAC) 20 MG/ML IV SOLN
INTRAVENOUS | Status: DC | PRN
  Administered 2016-04-21: 30 mg via INTRAVENOUS

## 2016-04-21 MED ORDER — ONDANSETRON HCL 4 MG/2ML IJ SOLN
INTRAMUSCULAR | Status: AC
  Filled 2016-04-21: qty 2

## 2016-04-21 MED ORDER — LIDOCAINE HCL (CARDIAC) 20 MG/ML IV SOLN
INTRAVENOUS | Status: AC
  Filled 2016-04-21: qty 5

## 2016-04-21 MED ORDER — ROCURONIUM BROMIDE 100 MG/10ML IV SOLN
INTRAVENOUS | Status: DC | PRN
  Administered 2016-04-21: 40 mg via INTRAVENOUS

## 2016-04-21 MED ORDER — PROPOFOL 10 MG/ML IV BOLUS
INTRAVENOUS | Status: DC | PRN
  Administered 2016-04-21: 170 mg via INTRAVENOUS

## 2016-04-21 MED ORDER — PROPOFOL 10 MG/ML IV BOLUS
INTRAVENOUS | Status: AC
  Filled 2016-04-21: qty 20

## 2016-04-21 MED ORDER — SODIUM CHLORIDE 0.9 % IJ SOLN
INTRAMUSCULAR | Status: DC | PRN
  Administered 2016-04-21: 10 mL via INTRAVENOUS

## 2016-04-21 MED ORDER — SUGAMMADEX SODIUM 200 MG/2ML IV SOLN
INTRAVENOUS | Status: DC | PRN
  Administered 2016-04-21: 270 mg via INTRAVENOUS

## 2016-04-21 MED ORDER — FENTANYL CITRATE (PF) 100 MCG/2ML IJ SOLN
25.0000 ug | INTRAMUSCULAR | Status: DC | PRN
  Administered 2016-04-21: 50 ug via INTRAVENOUS

## 2016-04-21 MED ORDER — HYDROCODONE-ACETAMINOPHEN 7.5-325 MG PO TABS: 1.0000 | ORAL_TABLET | Freq: Once | ORAL | Status: DC | PRN

## 2016-04-21 SURGICAL SUPPLY — 29 items
APL SRG 38 LTWT LNG FL B (MISCELLANEOUS)
APPLICATOR ARISTA FLEXITIP XL (MISCELLANEOUS) IMPLANT
CATH ROBINSON RED A/P 16FR (CATHETERS) ×1 IMPLANT
CHLORAPREP W/TINT 26ML (MISCELLANEOUS) ×3 IMPLANT
CLOTH BEACON ORANGE TIMEOUT ST (SAFETY) ×3 IMPLANT
DRSG OPSITE POSTOP 3X4 (GAUZE/BANDAGES/DRESSINGS) ×1 IMPLANT
GLOVE BIOGEL PI IND STRL 7.0 (GLOVE) ×4 IMPLANT
GLOVE BIOGEL PI INDICATOR 7.0 (GLOVE) ×2
GLOVE ECLIPSE 6.5 STRL STRAW (GLOVE) ×6 IMPLANT
LIQUID BAND (GAUZE/BANDAGES/DRESSINGS) ×3 IMPLANT
NEEDLE INSUFFLATION 120MM (ENDOMECHANICALS) ×3 IMPLANT
PACK LAPAROSCOPY BASIN (CUSTOM PROCEDURE TRAY) ×3 IMPLANT
PAD TRENDELENBURG POSITION (MISCELLANEOUS) ×3 IMPLANT
SCISSORS LAP 5X35 DISP (ENDOMECHANICALS) ×1 IMPLANT
SEALER TISSUE G2 CVD JAW 35 (ENDOMECHANICALS) IMPLANT
SEALER TISSUE G2 CVD JAW 45CM (ENDOMECHANICALS)
SET IRRIG TUBING LAPAROSCOPIC (IRRIGATION / IRRIGATOR) IMPLANT
SLEEVE XCEL OPT CAN 5 100 (ENDOMECHANICALS) ×3 IMPLANT
SUT VIC AB 4-0 SH 27 (SUTURE) ×3
SUT VIC AB 4-0 SH 27XANBCTRL (SUTURE) IMPLANT
SUT VICRYL 0 UR6 27IN ABS (SUTURE) ×1 IMPLANT
SUT VICRYL 4-0 PS2 18IN ABS (SUTURE) ×3 IMPLANT
TOWEL OR 17X24 6PK STRL BLUE (TOWEL DISPOSABLE) ×6 IMPLANT
TRAY FOLEY CATH SILVER 14FR (SET/KITS/TRAYS/PACK) ×1 IMPLANT
TROCAR BALLN 12MMX100 BLUNT (TROCAR) IMPLANT
TROCAR XCEL NON-BLD 11X100MML (ENDOMECHANICALS) ×1 IMPLANT
TROCAR XCEL NON-BLD 5MMX100MML (ENDOMECHANICALS) ×3 IMPLANT
WARMER LAPAROSCOPE (MISCELLANEOUS) ×3 IMPLANT
WATER STERILE IRR 1000ML POUR (IV SOLUTION) ×3 IMPLANT

## 2016-04-21 NOTE — Anesthesia Postprocedure Evaluation (Signed)
Anesthesia Post Note  Patient: Kathryn [name]  Procedure(s) Performed: Procedure(s) (LRB): LAPAROSCOPY DIAGNOSTIC (N/A) LYSIS OF ADHESION  Patient location during evaluation: PACU Anesthesia Type: General Level of consciousness: awake and sedated Pain management: pain level controlled Vital Signs Assessment: post-procedure vital signs reviewed and stable Respiratory status: spontaneous breathing Cardiovascular status: stable Postop Assessment: no signs of nausea or vomiting Anesthetic complications: no     Last Vitals:  Filed Vitals:   04/21/16 0942 04/21/16 0945  BP:  112/69  Pulse: 62 63  Temp:    Resp: 16 12    Last Pain:  Filed Vitals:   04/21/16 0954  PainSc: 4    Pain Goal: Patients Stated Pain Goal: 4 (04/21/16 0707)               Cyrena Kuchenbecker JR,JOHN Susann GivensFRANKLIN

## 2016-04-21 NOTE — Op Note (Signed)
04/21/2016  9:34 AM  PATIENT:  Kathryn [name]  35 y.o. female  PRE-OPERATIVE DIAGNOSIS:  pelvic pain  POST-OPERATIVE DIAGNOSIS:  pelvic pain  PROCEDURE:  Procedure(s): LAPAROSCOPY DIAGNOSTIC LYSIS OF ADHESION  SURGEON:  Keron Neenan SUZANNE  ASSISTANTS: Gertie Exon   ANESTHESIA:   general  ESTIMATED BLOOD LOSS: 10cc  BLOOD ADMINISTERED:none   FLUIDS: 800cc LR  UOP: 20cc clear UOP  SPECIMEN:  none  DISPOSITION OF SPECIMEN:  N/A  FINDINGS: normal pelvis and upper abdomen, adhesion of left ovary to sidewall.    DESCRIPTION OF OPERATION: Patient is taken to the operating room. She is placed in the supine position. She is a running IV in place. Informed consent was present on the chart. SCDs on her lower extremities and functioning properly. General endotracheal anesthesia was administered by the anesthesia staff without difficulty. Once adequate anesthesia was confirmed the legs are placed in the low lithotomy position in [place] stirrups. Her arms were tucked by the side.   Dura prep was then used to prep the abdomen and Betadine was used to prep the inner thighs, perineum and vagina. Once 3 minutes had past the patient was draped in a normal standard fashion. The legs were lifted to the high lithotomy position. The cervix was visualized by placing a heavy weighted speculum in the posterior aspect of the vagina and using a curved Deaver retractor to the retract anteriorly. The anterior lip of the cervix was grasped with single-tooth tenaculum.  The cervix sounded to 8 cm. Hulka clamp was applied to the anterior lip of the cervix.  Speculum was removed.  Foley catheter was placed to SD.  Legs were lowered to the low lithotomy position and attention was turned the abdomen.  The umbilicus was everted.  A Veress needle was obtained. Syringe of sterile saline was placed on a open Veress needle.  This was passed into the umbilicus until just when the fluid started to drip.  Then low flow  CO2 gas was attached the needle and the pneumoperitoneum was achieved without difficulty. Once four liters of gas was in the abdomen the Veress needle was removed and a 5 millimeter non-bladed Optiview trocar and port were passed directly to the abdomen. The laparoscope was then used to confirm intraperitoneal placement.  Upper abdomen and pelvis were surveyed.  No abnormalities were noted.  No endometriosis was noted except for an adhesion of the left fallopian tube/ovary to the left sidewall.    Locations for RLQ port was identified by transillumination of the abdominal wall.  0.25% marcaine was used to anesthetize the skin.  5mm skin incisions was made and then 5mm bladed port was placed.  Using a blunt probe, the entire ovary surface and peritoneum behind the ovaries were visualized.  There was no cyst on the left ovary at this point.  A corpus luteal cyst had been noted.  Then using laparoscopic scissors, the adhesion was incised.  No bleeding was noted.  There was a prominent uterine artery noted on the right side of the uterus but the pt's pain was all left sided so I felt this was just an incidental finding.  At this point the procedure was completed.  Instruments were removed.  Pneumoperitoneum was relieved.  The port was removed under direct visualization of the laparoscope.  The patient was taken out of Trendelenburg positioning.  Several deep breaths were given to the patient's trying to any gas the abdomen and finally the midline port was removed.  The skin  was then closed with liquiband.  The skin was cleansed Dermabond was applied. Foley was removed. Sponge, lap, needle, initially counts were correct x2. Patient tolerated the procedure very well. She was awakened from anesthesia, extubated and taken to recovery in stable condition.   COUNTS:  YES  PLAN OF CARE: Transfer to PACU

## 2016-04-21 NOTE — Anesthesia Preprocedure Evaluation (Signed)
Anesthesia Evaluation  Patient identified by MRN, date of birth, ID band Patient awake    Reviewed: Allergy & Precautions, H&P , NPO status , Patient's Chart, lab work & pertinent test results  Airway Mallampati: I  TM Distance: >3 FB Neck ROM: full    Dental no notable dental hx. (+) Teeth Intact   Pulmonary neg pulmonary ROS,    Pulmonary exam normal        Cardiovascular negative cardio ROS Normal cardiovascular exam     Neuro/Psych negative psych ROS   GI/Hepatic negative GI ROS, Neg liver ROS,   Endo/Other  negative endocrine ROS  Renal/GU negative Renal ROS     Musculoskeletal   Abdominal Normal abdominal exam  (+)   Peds  Hematology negative hematology ROS (+)   Anesthesia Other Findings   Reproductive/Obstetrics negative OB ROS                             Anesthesia Physical Anesthesia Plan  ASA: I  Anesthesia Plan: General   Post-op Pain Management:    Induction: Intravenous  Airway Management Planned: Oral ETT  Additional Equipment:   Intra-op Plan:   Post-operative Plan:   Informed Consent: I have reviewed the patients History and Physical, chart, labs and discussed the procedure including the risks, benefits and alternatives for the proposed anesthesia with the patient or authorized representative who has indicated his/her understanding and acceptance.   Dental Advisory Given  Plan Discussed with: CRNA and Surgeon  Anesthesia Plan Comments:         Anesthesia Quick Evaluation

## 2016-04-21 NOTE — Discharge Instructions (Signed)
Post-surgical Instructions, Outpatient Surgery  You may expect to feel dizzy, weak, and drowsy for as long as 24 hours after receiving the medicine that made you sleep (anesthetic). For the first 24 hours after your surgery:    Do not drive a car, ride a bicycle, participate in physical activities, or take public transportation until you are done taking narcotic pain medicines or as directed by Dr. Hyacinth MeekerMiller.   Do not drink alcohol or take tranquilizers.   Do not take medicine that has not been prescribed by your physicians.   Do not sign important papers or make important decisions while on narcotic pain medicines.   Have a responsible person with you.   CARE OF INCISION  If you have a bandage, you may remove it in one day.  If there are steri-strips or dermabond, just let this loosen on its own.   You may shower on the first day after your surgery.  Do not sit in a tub bath for one week.  Avoid heavy lifting (more than 10 pounds/4.5 kilograms), pushing, or pulling.   Avoid activities that may risk injury to your incisions.   PAIN MANAGEMENT  Motrin 800mg .  (This is the same as 4-200mg  over the counter tablets of Motrin or ibuprofen.)  You may take this every eight hours or as needed for cramping/pain.  Tramadol 50mg  tabs, 1-2 every 6 hours as needed for pain.  You can alternate this with the Motrin as well.  DO'S AND DON'T'S  Do not take a tub bath for one week.  You may shower on the first day after your surgery  Do not do any heavy lifting for one to two weeks.  This increases the chance of bleeding.  Do move around as you feel able.  Stairs are fine.  You may begin to exercise again as you feel able.  Do not lift any weights for two weeks.  Do not put anything in the vagina for two weeks--no tampons, intercourse, or douching.    REGULAR MEDIATIONS/VITAMINS:  You may restart all of your regular medications as prescribed.  You may restart all of your vitamins as you  normally take them.    PLEASE CALL OR SEEK MEDICAL CARE IF:  You have persistent nausea and vomiting.   You have trouble eating or drinking.   You have an oral temperature above 100.5.   You have constipation that is not helped by adjusting diet or increasing fluid intake. Pain medicines are a common cause of constipation.   You have heavy vaginal bleeding  You have redness or drainage from your incision(s) or there is increasing pain or tenderness near or in the surgical site.

## 2016-04-21 NOTE — Anesthesia Procedure Notes (Signed)
Procedure Name: Intubation Date/Time: 04/21/2016 8:25 AM Performed by: Suella GroveMOORE, Benjerman Molinelli C Pre-anesthesia Checklist: Patient identified, Emergency Drugs available, Suction available, Patient being monitored and Timeout performed Patient Re-evaluated:Patient Re-evaluated prior to inductionOxygen Delivery Method: Circle system utilized and Simple face mask Preoxygenation: Pre-oxygenation with 100% oxygen Intubation Type: IV induction Ventilation: Mask ventilation without difficulty Laryngoscope Size: Mac and 3 Grade View: Grade II Tube type: Oral Tube size: 7.0 mm Number of attempts: 1 Airway Equipment and Method: Stylet Placement Confirmation: ETT inserted through vocal cords under direct vision,  positive ETCO2 and breath sounds checked- equal and bilateral Secured at: 22 cm Tube secured with: Tape Dental Injury: Teeth and Oropharynx as per pre-operative assessment

## 2016-04-21 NOTE — H&P (Signed)
Kathryn [name] is an 35 y.o. female  G3P2 Separated White Female here for diagnostic laparoscopy due to intermittent LLQ pain that has characteristics of endometriosis.  This really started when she stopped her OCPs last fall.  Ultrasound done 03/19/16 was normal.  Pt was started back on OCPs in late march.  She has experienced some headache issues with this so she is not sure she wants to continue.  She is desirous of definitive answers and diagnostic laparoscopy is planned for today.  Pt aware that is endometriosis is seen, biopsy and treatment will be performed.  As well, if the left tube or ovary are significantly involved, removal of these organs is possible.  Risks and benefits have been explained.  Pt is here and ready to proceed.    Pertinent Gynecological History: Menses: regular Bleeding: dysmenorrhea Contraception: abstinence DES exposure: denies Blood transfusions: none Sexually transmitted diseases: no past history Previous GYN Procedures: none  Last mammogram: n/a Date: n/a Last pap: normal Date: 8/16 OB History: G3, P2   Menstrual History: Patient's last menstrual period was 04/12/2016.    Past Medical History  Diagnosis Date  . Abnormal Pap smear     ASCUS +HPV  . Migraines     Past Surgical History  Procedure Laterality Date  . Colposcopy  2005    neg  . Knee surgery Left     Family History  Problem Relation Age of Onset  . Cancer Mother     cervical  . Hyperlipidemia Mother   . Stroke Father   . Other Father     pulmonary fibrosis    Social History:  reports that she has never smoked. She has never used smokeless tobacco. She reports that she drinks about 1.2 oz of alcohol per week. She reports that she does not use illicit drugs.  Allergies: No Known Allergies  Prescriptions prior to admission  Medication Sig Dispense Refill Last Dose  . acetaminophen (TYLENOL) 500 MG tablet Take 1,000 mg by mouth every 6 (six) hours as needed for headache. Reported  on 04/20/2016   Past Month at Unknown time  . b complex vitamins tablet Take 1 tablet by mouth daily. Reported on 04/20/2016   Past Month at Unknown time  . ibuprofen (ADVIL,MOTRIN) 200 MG tablet Take 600 mg by mouth every 6 (six) hours as needed for headache or moderate pain. Reported on 04/20/2016   Past Week at Unknown time  . loratadine (CLARITIN) 10 MG tablet Take 10 mg by mouth daily as needed for allergies. Reported on 04/20/2016   Past Month at Unknown time  . Multiple Vitamins-Minerals (MULTIVITAMIN PO) Take by mouth daily. Reported on 04/20/2016   Past Month at Unknown time  . Norethindrone Acetate-Ethinyl Estradiol (LOESTRIN 1.5/30, 21,) 1.5-30 MG-MCG tablet Take 1 tablet by mouth daily. 3 Package 3 04/21/2016 at 0600  . SUMAtriptan (IMITREX) 50 MG tablet Take 50 mg by mouth every 2 (two) hours as needed for migraine. May repeat in 2 hours if headache persists or recurs.   Past Week at Unknown time  . traMADol (ULTRAM) 50 MG tablet 1-2 tab po q 6 hours prn pain 30 tablet 0 Past Week at Unknown time  . UNABLE TO FIND daily. Reported on 04/20/2016   Not Taking  . UNABLE TO FIND daily. Reported on 04/20/2016   Not Taking    Review of Systems  All other systems reviewed and are negative.   Blood pressure 114/81, pulse 75, temperature 98.1 F (36.7 C), temperature  source Oral, resp. rate 16, last menstrual period 04/12/2016, SpO2 99 %. Physical Exam  Constitutional: She is oriented to person, place, and time. She appears well-developed and well-nourished.  Cardiovascular: Normal rate and regular rhythm.   Respiratory: Effort normal and breath sounds normal.  Neurological: She is alert and oriented to person, place, and time.  Skin: Skin is warm and dry.  Psychiatric: She has a normal mood and affect.    Results for orders placed or performed during the hospital encounter of 04/20/16 (from the past 24 hour(s))  CBC     Status: None   Collection Time: 04/20/16 10:41 AM  Result Value Ref Range    WBC 5.3 4.0 - 10.5 K/uL   RBC 4.27 3.87 - 5.11 MIL/uL   Hemoglobin 12.8 12.0 - 15.0 g/dL   HCT 40.9 81.1 - 91.4 %   MCV 87.8 78.0 - 100.0 fL   MCH 30.0 26.0 - 34.0 pg   MCHC 34.1 30.0 - 36.0 g/dL   RDW 78.2 95.6 - 21.3 %   Platelets 238 150 - 400 K/uL    No results found.  Assessment/Plan: 35 yo with LLQ pain here for diagnostic laparoscopy with possible treatment of endometriosis and possible LSO.  All questions answered.  Pt here and ready to proceed.  [name] [facility] 04/21/2016, 7:53 AM

## 2016-04-21 NOTE — Transfer of Care (Signed)
Immediate Anesthesia Transfer of Care Note  Patient: Kathryn [name]  Procedure(s) Performed: Procedure(s) with comments: LAPAROSCOPY DIAGNOSTIC (N/A) - Corky, J-plasma rep will be here per Kennon RoundsSally in Dr.'s Office.  Confirmed on 04/17/16 LYSIS OF ADHESION  Patient Location: PACU  Anesthesia Type:General  Level of Consciousness: awake, alert , oriented and patient cooperative  Airway & Oxygen Therapy: Patient Spontanous Breathing and Patient connected to nasal cannula oxygen  Post-op Assessment: Report given to RN and Post -op Vital signs reviewed and stable  Post vital signs: Reviewed and stable  Last Vitals:  Filed Vitals:   04/21/16 0707  BP: 114/81  Pulse: 75  Temp: 36.7 C  Resp: 16    Last Pain:  Filed Vitals:   04/21/16 0710  PainSc: 4       Patients Stated Pain Goal: 4 (04/21/16 0707)  Complications: No apparent anesthesia complications

## 2016-04-22 ENCOUNTER — Encounter (HOSPITAL_COMMUNITY): Payer: Self-pay | Admitting: Obstetrics & Gynecology

## 2016-05-04 ENCOUNTER — Ambulatory Visit (INDEPENDENT_AMBULATORY_CARE_PROVIDER_SITE_OTHER): Payer: Commercial Managed Care - HMO | Admitting: Obstetrics & Gynecology

## 2016-05-04 ENCOUNTER — Encounter: Payer: Self-pay | Admitting: Obstetrics & Gynecology

## 2016-05-04 VITALS — BP 102/64 | HR 78 | Resp 12 | Wt 149.0 lb

## 2016-05-04 DIAGNOSIS — Z9889 Other specified postprocedural states: Secondary | ICD-10-CM

## 2016-05-04 MED ORDER — MELOXICAM 15 MG PO TABS: 15.0000 mg | ORAL_TABLET | Freq: Every day | ORAL | Status: DC

## 2016-05-04 NOTE — Progress Notes (Signed)
Post Operative Visit  Procedure:LAPAROSCOPY DIAGNOSTIC, LYSIS OF ADHESION Days Post-op: 13 days  Subjective: Pt is doing well.  Reports she did have some bloating the first week after the surgery.  This has resolved.  Her surgical pain is resolved.  LLQ pain is still present.  [facility].  X-rays were normal.  She was started on prednisone and she states that hasn't really done anything to help the symptoms.    Pt is paying very close attention to her diet to see if this is causing any of the pain.  Current OCP pack will finish on Sunday so she will start her cycle next week.  A little anxious about having headaches like she did with the first month.  D/W pt switching to POPs, if needed for contraception.  Pt not SA at this time.    Has not seen a GI yet.  Appt is May 22th.  This is with Dr. Madilyn FiremanHayes.    Objective: LMP 04/12/2016 (Exact Date)  EXAM General: alert, cooperative and appears stated age GI: soft, non-tender; bowel sounds normal; no masses,  no organomegaly and incision: clean, dry and intact Extremities: extremities normal, atraumatic, no cyanosis or edema  Assessment: s/p diagnostic laparoscopy with LOA due to pelvic pain.  No endometriosis or other abnormalities noted  Plan: Pt has MRI scheduled for Sat.  Release of records from Ortho signed today. GI appt is Monday, May 22 Trial of Mobic 15mg  daily.  Rx to pharmacy. If all of above evaluation is negative, will refer to urology for evaluation of IC.  Symptoms are not consistent with this at this time, however that will need evaluation if everything else is negative.

## 2016-05-13 ENCOUNTER — Encounter: Payer: Self-pay | Admitting: Obstetrics and Gynecology

## 2016-05-13 ENCOUNTER — Telehealth: Payer: Self-pay | Admitting: Certified Nurse Midwife

## 2016-05-13 ENCOUNTER — Ambulatory Visit (INDEPENDENT_AMBULATORY_CARE_PROVIDER_SITE_OTHER): Payer: Commercial Managed Care - HMO | Admitting: Obstetrics and Gynecology

## 2016-05-13 VITALS — BP 100/60 | HR 80 | Temp 98.0°F | Resp 15 | Wt 151.0 lb

## 2016-05-13 DIAGNOSIS — R109 Unspecified abdominal pain: Secondary | ICD-10-CM | POA: Diagnosis not present

## 2016-05-13 DIAGNOSIS — R198 Other specified symptoms and signs involving the digestive system and abdomen: Secondary | ICD-10-CM | POA: Diagnosis not present

## 2016-05-13 MED ORDER — TRAMADOL HCL 50 MG PO TABS: ORAL_TABLET | ORAL | Status: DC

## 2016-05-13 NOTE — Telephone Encounter (Signed)
Message left to return call to Kathryn [name] at [phone].    

## 2016-05-13 NOTE — Telephone Encounter (Signed)
Call to patient and she complains of LLQ pain that is worsening to her.  She is s/p diagnostic laparoscopy 04/21/16.  Has seen GI and has colonoscopy scheduled.  Has seen orthopedics and MRI completed 5/20-Patient states she was contacted and was advised MRI is normal.  She is currently on her cycle and states she does not plan to restart her OCP due to headaches. She is not sexually active.  Patient reports pain has worsened since 05/09/16. Hurts with eating. She states she alternates between constipation and diarrhea. Feels like her abdomen is swollen and unable to pas gas.  Unsure when last normal BM was. No diarrhea today, no vomiting today. Feels nauseated reports "intense nausea" so unable to eat due to nausea. Reports doesn't feel like it is a stomach virus. Felt better when she stopped prednisone that was ordered by Ortho, but started Meloxicam as ordered by Dr. Hyacinth MeekerMiller and does not feel improved.  States she is calling our office because she feels Dr. Hyacinth MeekerMiller is only provider who has listened to her concerns.  Advised can have her be seen today by Dr. Oscar LaJertson, Dr. Hyacinth MeekerMiller not in office. Can evaluate in office and if needs further may need to go to ED after evaluation. Patient agrees and feels like she can drive.  Office visit today at 1515, Dr. Oscar LaJertson aware.  Routing to provider for final review. Patient agreeable to disposition. Will close encounter.  cc Dr. Hyacinth MeekerMiller.

## 2016-05-13 NOTE — Telephone Encounter (Signed)
Patient returning call.

## 2016-05-13 NOTE — Telephone Encounter (Signed)
Patient wants to speak with the nurse no infomation given.

## 2016-05-13 NOTE — Progress Notes (Signed)
Patient ID: Kathryn [name], female   DOB: May 05, 1981, 35 y.o.   MRN: [id number] GYNECOLOGY  VISIT   HPI: 35 y.o.   Married  Caucasian  female   [phone] with Patient's last menstrual period was 05/11/2016.  The patient is 3 weeks s/p diagnostic laparoscopy and minimal LOA. here c/o LLQ pain with nausea and vomiting. She is having the same pain that she had prior to surgery in the LLQ, but worse. She saw a GI MD on Monday, he didn't feel he would find anything on colonoscopy. When her pain flares, she gets nausea, emesis, stools are different, either constipated or diarrhea. The pain goes away, it eases, on Saturday it started getting worse, now it's on her whole left side (of her abdomen). Pain worsens after she eats, doesn't improve with BM. No urinary c/o. She has eliminated gluten without help. Still having dairy. She had an MRI with ortho, no specific findings.  She tried mobic, not helping. In the past ibuprofen helped some previously. In the past oxycodone made her sick. She did tolerate tramadol post op.  No fevers.  She is on OCP's, getting migraines the week off. Not helping her pain so she will stop them.   GYNECOLOGIC HISTORY: Patient's last menstrual period was 05/11/2016. Contraception:OCP Menopausal hormone therapy: none        OB History    Gravida Para Term Preterm AB TAB SAB Ectopic Multiple Living   Patient Active Problem List   Diagnosis Date Noted  . Pelvic pain in female 03/21/2016  . Migraine without aura 03/21/2016    Past Medical History  Diagnosis Date  . Abnormal Pap smear     ASCUS +HPV  . Migraines     Past Surgical History  Procedure Laterality Date  . Colposcopy  2005    neg  . Knee surgery Left   . Laparoscopy N/A 04/21/2016    Procedure: LAPAROSCOPY DIAGNOSTIC;  Surgeon: Jerene Bears, MD;  Location: [facility];  Service: Gynecology;  Laterality: N/A;  Corky, J-plasma rep will be here per Kennon Rounds in Dr.'s Office.  Confirmed on  04/17/16  . Lysis of adhesion  04/21/2016    Procedure: LYSIS OF ADHESION;  Surgeon: Jerene Bears, MD;  Location: [facility];  Service: Gynecology;;    Current Outpatient Prescriptions  Medication Sig Dispense Refill  . b complex vitamins tablet Take 1 tablet by mouth daily. Reported on 04/20/2016    . meloxicam (MOBIC) 15 MG tablet Take 1 tablet (15 mg total) by mouth daily. 30 tablet 1  . Norethindrone Acetate-Ethinyl Estradiol (LOESTRIN 1.5/30, 21,) 1.5-30 MG-MCG tablet Take 1 tablet by mouth daily. 3 Package 3  . acetaminophen (TYLENOL) 500 MG tablet Take 1,000 mg by mouth every 6 (six) hours as needed for headache. Reported on 05/13/2016    . loratadine (CLARITIN) 10 MG tablet Take 10 mg by mouth daily as needed for allergies. Reported on 05/13/2016    . Multiple Vitamins-Minerals (MULTIVITAMIN PO) Take by mouth daily. Reported on 05/13/2016    . SUMAtriptan (IMITREX) 50 MG tablet Take 50 mg by mouth every 2 (two) hours as needed for migraine. Reported on 05/13/2016     No current facility-administered medications for this visit.     ALLERGIES: Review of patient's allergies indicates no known allergies.  Family History  Problem Relation Age of Onset  . Cancer Mother  cervical  . Hyperlipidemia Mother   . Stroke Father   . Other Father     pulmonary fibrosis    Social History   Social History  . Marital Status: Married    Spouse Name: N/A  . Number of Children: N/A  . Years of Education: N/A   Occupational History  . Not on file.   Social History Main Topics  . Smoking status: Never Smoker   . Smokeless tobacco: Never Used  . Alcohol Use: 1.2 oz/week    2 Standard drinks or equivalent per week  . Drug Use: No  . Sexual Activity:    Partners: Male    Birth Control/ Protection:    Other Topics Concern  . Not on file   Social History Narrative    Review of Systems  Constitutional: Negative.   Eyes: Negative.   Respiratory: Negative.   Cardiovascular: Negative.    Gastrointestinal: Positive for nausea, vomiting, abdominal pain and diarrhea.  Genitourinary:       LLQ pain Bloating   Musculoskeletal: Negative.   Skin: Negative.   Neurological: Positive for headaches.  Endo/Heme/Allergies: Negative.   Psychiatric/Behavioral: Negative.     PHYSICAL EXAMINATION:    BP 100/60 mmHg  Pulse 80  Temp(Src) 98 F (36.7 C)  Resp 15  Wt 151 lb (68.493 kg)  LMP 05/11/2016    General appearance: alert, cooperative and appears stated age Abdomen: soft, very tender over the area of her descending colon, stool palpated in the descending colon. Mildly tender in the LUQ, no rebound, no guarding, no masses. Minimally distended CVA: not tender  Pelvic: External genitalia:  no lesions              Urethra:  normal appearing urethra with no masses, tenderness or lesions                            Cervix: no cervical motion tenderness              Bimanual Exam:  Uterus:  normal size, contour, position, consistency, mobility, non-tender              Adnexa: no mass, fullness, tenderness and no masses, minimally tender with palpation of the left adnexa (less tender than her descending colon)              Chaperone was present for exam.  ASSESSMENT Left sided abdominal pain, worst in the LLQ, tender over the area of her descending colon. She has issues with diarrhea/constipaiton. She has had a laparoscopy with minimal LOA. Her pain is worse now. She does not have a surgical abdomen.    PLAN Check urine dip for blood: negative Stop mobic, try anaprox ds, tramadol for BTB She will go ahead with the colonoscopy on 05/20/16 Will try eliminating dairy from her diet Try metamucil right now for diarrhea/constipation Recommend she discuss IBS medicaitons with GI   An After Visit Summary was printed and given to the patient.  25 minutes face to face time of which over 50% was spent in counseling.

## 2016-05-26 ENCOUNTER — Telehealth: Payer: Self-pay | Admitting: Certified Nurse Midwife

## 2016-05-26 NOTE — Telephone Encounter (Signed)
Patient called to check on the status of the referral to Dr. Loreta AveMann as recommended by Dr. Oscar LaJertson.

## 2016-05-27 ENCOUNTER — Other Ambulatory Visit (HOSPITAL_COMMUNITY): Payer: Self-pay | Admitting: Gastroenterology

## 2016-05-27 DIAGNOSIS — R1110 Vomiting, unspecified: Secondary | ICD-10-CM

## 2016-05-27 DIAGNOSIS — R6881 Early satiety: Secondary | ICD-10-CM

## 2016-05-29 ENCOUNTER — Other Ambulatory Visit: Payer: Self-pay | Admitting: Obstetrics and Gynecology

## 2016-05-29 DIAGNOSIS — R1030 Lower abdominal pain, unspecified: Secondary | ICD-10-CM

## 2016-06-02 DIAGNOSIS — K580 Irritable bowel syndrome with diarrhea: Secondary | ICD-10-CM | POA: Insufficient documentation

## 2016-06-04 ENCOUNTER — Ambulatory Visit (HOSPITAL_COMMUNITY): Payer: Commercial Managed Care - HMO | Attending: Gastroenterology

## 2016-06-25 NOTE — Telephone Encounter (Signed)
Has this been completed? Okay to close encounter or is further follow up needed?

## 2016-06-25 NOTE — Telephone Encounter (Signed)
It looks like the referral was declined? Please look at the referral and set her up for an appointment with Dr Loreta AveMann.

## 2016-06-26 NOTE — Telephone Encounter (Signed)
[name] as there are notes with patient information. It appears patient was to call back if she would like a referral to Dr. Loreta [name].   05/29/2016 11:17 AM [name], [name]   I spoke with Ms Kathryn [name] per Dr Oscar LaJertson request. She stated she did have her colonoscopy on 05/20/16 and had a follow up after with her GI. She states she is very unhappy with that GI and felt he could not answer her questions or take into consideration her concern of bacteria or parasites. They mutually agreed his practice may not be the best fit for her. She also spoke with her PCP who recommended Dr Kathryn [name].   Patient stated she would like to proceed with her PCP referral to Dr Kathryn [name] and if that does not seem like a good fit she will contact our office for an appointment with Dr Kathryn [name].

## 2016-07-24 ENCOUNTER — Ambulatory Visit (INDEPENDENT_AMBULATORY_CARE_PROVIDER_SITE_OTHER): Payer: BLUE CROSS/BLUE SHIELD | Admitting: Certified Nurse Midwife

## 2016-07-24 ENCOUNTER — Encounter: Payer: Self-pay | Admitting: Certified Nurse Midwife

## 2016-07-24 VITALS — BP 100/68 | HR 70 | Resp 16 | Ht 65.0 in | Wt 155.0 lb

## 2016-07-24 DIAGNOSIS — Z3041 Encounter for surveillance of contraceptive pills: Secondary | ICD-10-CM | POA: Diagnosis not present

## 2016-07-24 DIAGNOSIS — B373 Candidiasis of vulva and vagina: Secondary | ICD-10-CM

## 2016-07-24 DIAGNOSIS — B3731 Acute candidiasis of vulva and vagina: Secondary | ICD-10-CM

## 2016-07-24 DIAGNOSIS — Z113 Encounter for screening for infections with a predominantly sexual mode of transmission: Secondary | ICD-10-CM

## 2016-07-24 MED ORDER — FLUCONAZOLE 150 MG PO TABS: 150.0000 mg | ORAL_TABLET | Freq: Once | ORAL | 0 refills | Status: AC

## 2016-07-24 NOTE — Patient Instructions (Signed)

## 2016-07-24 NOTE — Progress Notes (Signed)
Encounter reviewed Tassie Pollett, MD   

## 2016-07-24 NOTE — Progress Notes (Signed)
35 y.o. Married Caucasian female [phone] here with complaint of vaginal symptoms of irritation and slight burning,. . Describes discharge as normal for her. Onset of symptoms 4 days ago. Denies no new personal products or vaginal dryness.Sexually active again with new partner, no condom use at first and then condom use. Desires STD screening.Marland Kitchen Urinary symptoms none . Contraception is OCP. Patient also was in a wet suit for 8 hours and feels this may be cause of the vaginal irritation.  Also found the problem with pelvic pain to be GI. Now on no stone fruit diet and all has resolved. Saw specialist at [facility]. Still having headaches on placebo of OCP, no migraine just headache. No other health issues today.   O:Healthy female WDWN Affect: normal, orientation x 3  Exam: Abdomen: Lymph node: no enlargement or tenderness Pelvic exam: External genital: normal female BUS: negative Vagina: white slightly thick discharge noted. Ph:4.5   ,Wet prep taken, Affirm taken Cervix: normal, non tender, no CMT Uterus: normal, non tender Adnexa:normal, non tender, no masses or fullness noted   Wet Prep results: Saline, KOH positive for yeast only   A:Normal pelvic exam Contraception OCP Yeast vaginitis/vulvitis STD screening   P:Discussed normal pelvic exam. Discussed continuous use OCP with current pill with instruction for trial to see if no withdrawal headache. Will advise at aex or call if concerns. Questions addressed.  Discussed findings of yeast vaginitis/vulvitis and etiology. Discussed Aveeno or baking soda sitz bath for comfort. Avoid moist clothes for extended period of time. If working out in gym clothes or swim suits for long periods of time change underwear or bottoms of swimsuit if possible. Coconut Oil use for skin protection prior to activity can be used to external skin for protection or dryness. Rx: Diflucan see order with instructions Labs: GC,Chlamydia, Affirm Will do HIV,RPR at  aex  Rv prn

## 2016-07-25 LAB — WET PREP BY MOLECULAR PROBE
Candida species: POSITIVE — AB
GARDNERELLA VAGINALIS: NEGATIVE
Trichomonas vaginosis: NEGATIVE

## 2016-07-28 LAB — IPS N GONORRHOEA AND CHLAMYDIA BY PCR

## 2016-07-31 ENCOUNTER — Telehealth: Payer: Self-pay | Admitting: Certified Nurse Midwife

## 2016-07-31 NOTE — Telephone Encounter (Signed)
[facility]  Routing triage for review

## 2016-07-31 NOTE — Telephone Encounter (Signed)
Left message to call Triage Nurse [phone].

## 2016-08-07 NOTE — Telephone Encounter (Signed)
Patient received results via voicemail per DPR by Joy on 07/31/16 @ 10:47am.

## 2016-08-27 ENCOUNTER — Ambulatory Visit: Payer: BLUE CROSS/BLUE SHIELD | Admitting: Certified Nurse Midwife

## 2016-08-27 ENCOUNTER — Telehealth: Payer: Self-pay | Admitting: Certified Nurse Midwife

## 2016-08-27 NOTE — Telephone Encounter (Signed)
See note about aex

## 2016-08-27 NOTE — Telephone Encounter (Signed)
Patient called and cancelled her AEX today. She left a message early this morning and said, "I have a fever of 101 and a bad sore throat. I need to see my primary care physician." I'll call back to reschedule.   I called the patient back and left a message to confirm we received the message. Patient is in recall.

## 2016-08-27 NOTE — Progress Notes (Deleted)
35 y.o. [id number] Legally Separated  {Race/ethnicity:17218} Fe here for annual exam.    No LMP recorded.          Sexually active: {yes no:314532}  The current method of family planning is {contraception:315051}.    Exercising: {yes no:314532}  {types:19826} Smoker:  {YES NO:22349}  Health Maintenance: Pap:  08-03-13 neg MMG:  2012? Colonoscopy:  none BMD:   none TDaP:  2010 Shingles: no Pneumonia: no Hep C and HIV: *** Labs:  Self breast exam:   reports that she has never smoked. She has never used smokeless tobacco. She reports that she drinks about 1.2 oz of alcohol per week . She reports that she does not use drugs.  Past Medical History:  Diagnosis Date  . Abnormal Pap smear    ASCUS +HPV  . Migraines     Past Surgical History:  Procedure Laterality Date  . COLPOSCOPY  2005   neg  . KNEE SURGERY Left   . LAPAROSCOPY N/A 04/21/2016   Procedure: LAPAROSCOPY DIAGNOSTIC;  Surgeon: Jerene Bears, MD;  Location: [facility];  Service: Gynecology;  Laterality: N/A;  Corky, J-plasma rep will be here per Kennon Rounds in Dr.'s Office.  Confirmed on 04/17/16  . LYSIS OF ADHESION  04/21/2016   Procedure: LYSIS OF ADHESION;  Surgeon: Jerene Bears, MD;  Location: [facility];  Service: Gynecology;;    Current Outpatient Prescriptions  Medication Sig Dispense Refill  . acetaminophen (TYLENOL) 500 MG tablet Take 1,000 mg by mouth every 6 (six) hours as needed for headache. Reported on 05/13/2016    . b complex vitamins tablet Take 1 tablet by mouth daily. Reported on 04/20/2016    . ibuprofen (ADVIL,MOTRIN) 200 MG tablet Take 200 mg by mouth.    . Multiple Vitamins-Minerals (MULTIVITAMIN PO) Take by mouth daily. Reported on 05/13/2016    . Norethindrone Acetate-Ethinyl Estradiol (LOESTRIN 1.5/30, 21,) 1.5-30 MG-MCG tablet Take 1 tablet by mouth daily. 3 Package 3  . SUMAtriptan (IMITREX) 50 MG tablet Take 50 mg by mouth every 2 (two) hours as needed for migraine. Reported on 05/13/2016     No current  facility-administered medications for this visit.     Family History  Problem Relation Age of Onset  . Cancer Mother     cervical  . Hyperlipidemia Mother   . Stroke Father   . Other Father     pulmonary fibrosis    ROS:  Pertinent items are noted in HPI.  Otherwise, a comprehensive ROS was negative.  Exam:   There were no vitals taken for this visit.   Ht Readings from Last 3 Encounters:  07/24/16 5\' 5"  (1.651 m)  04/20/16 5\' 5"  (1.651 m)  04/20/16 5\' 5"  (1.651 m)    General appearance: alert, cooperative and appears stated age Head: Normocephalic, without obvious abnormality, atraumatic Neck: no adenopathy, supple, symmetrical, trachea midline and thyroid {EXAM; THYROID:18604} Lungs: clear to auscultation bilaterally Breasts: {Exam; breast:13139::"normal appearance, no masses or tenderness"} Heart: regular rate and rhythm Abdomen: soft, non-tender; no masses,  no organomegaly Extremities: extremities normal, atraumatic, no cyanosis or edema Skin: Skin color, texture, turgor normal. No rashes or lesions Lymph nodes: Cervical, supraclavicular, and axillary nodes normal. No abnormal inguinal nodes palpated Neurologic: Grossly normal   Pelvic: External genitalia:  no lesions              Urethra:  normal appearing urethra with no masses, tenderness or lesions  Bartholin's and Skene's: normal                 Vagina: normal appearing vagina with normal color and discharge, no lesions              Cervix: {exam; cervix:14595}              Pap taken: {yes no:314532} Bimanual Exam:  Uterus:  {exam; uterus:12215}              Adnexa: {exam; adnexa:12223}               Rectovaginal: Confirms               Anus:  normal sphincter tone, no lesions  Chaperone present: ***  A:  Well Woman with normal exam  P:   Reviewed health and wellness pertinent to exam  Pap smear as above  {plan; gyn:5269::"mammogram","pap smear","return annually or prn"}  An After Visit  Summary was printed and given to the patient.

## 2017-01-21 DIAGNOSIS — J111 Influenza due to unidentified influenza virus with other respiratory manifestations: Secondary | ICD-10-CM | POA: Diagnosis not present

## 2017-01-21 DIAGNOSIS — R52 Pain, unspecified: Secondary | ICD-10-CM | POA: Diagnosis not present

## 2017-01-21 DIAGNOSIS — R0981 Nasal congestion: Secondary | ICD-10-CM | POA: Diagnosis not present

## 2017-02-04 DIAGNOSIS — F4323 Adjustment disorder with mixed anxiety and depressed mood: Secondary | ICD-10-CM | POA: Diagnosis not present

## 2017-05-05 IMAGING — CT CT ABD-PELV W/ CM
2 of 4 series · 16 of 46 positions shown, 18 images · IV contrast (APPLIED)
Comparison: Pelvic ultrasound 03/19/2016; report not available

CLINICAL DATA: Left lower quadrant pain for 2 months.

EXAM:
CT ABDOMEN AND PELVIS WITH CONTRAST
TECHNIQUE: Multidetector CT imaging of the abdomen and pelvis was performed
using the standard protocol following bolus administration of
intravenous contrast.
CONTRAST:  100mL XVDSWV-066 IOPAMIDOL (XVDSWV-066) INJECTION 61%

[Series 2: abd/pelvis w/cm · axial · 0.62mm/px · z∈[+386,+791]mm · 13 of 89 slices shown, 15 images]
[im 4/89  soft-tissue]
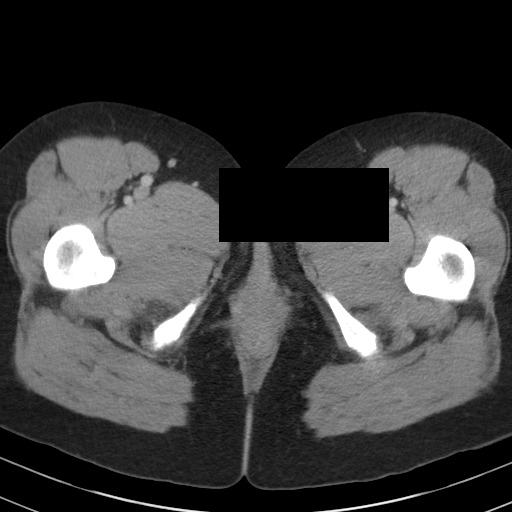
[im 4/89  bone]
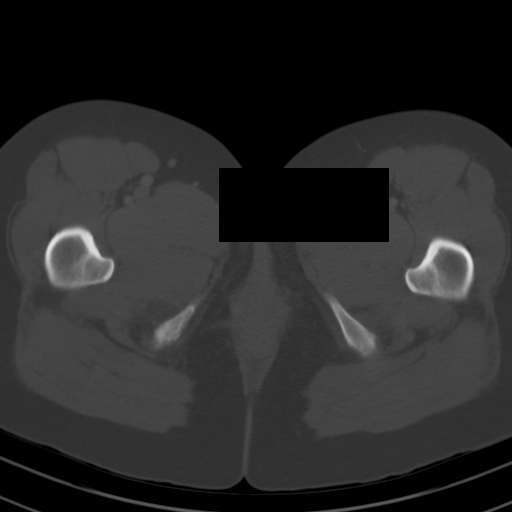
[im 11/89  soft-tissue]
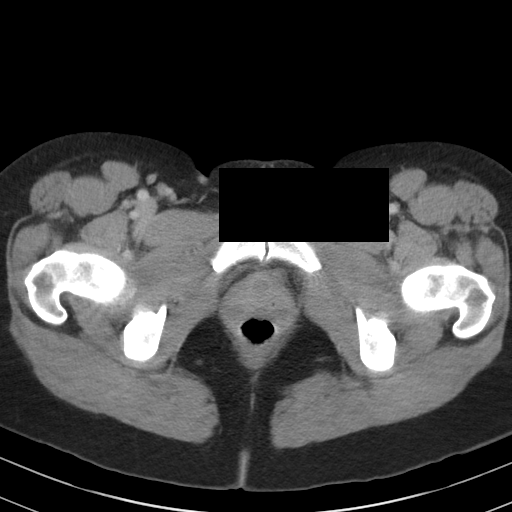
[im 18/89  soft-tissue]
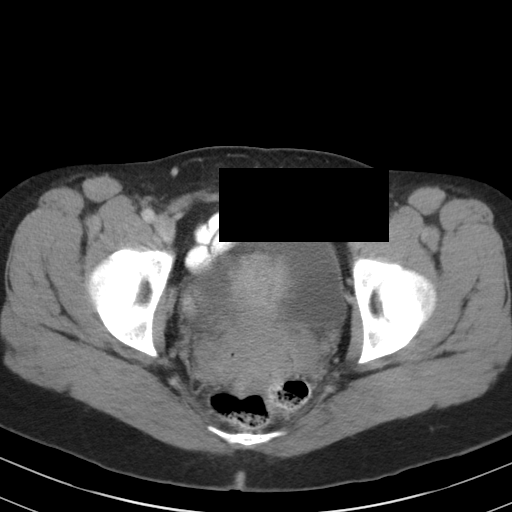
[im 25/89  soft-tissue]
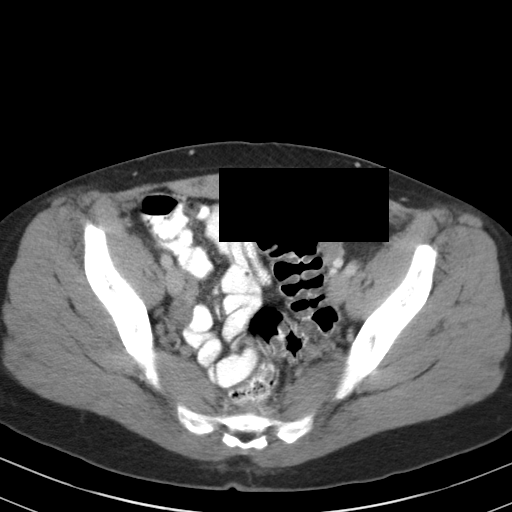
[im 32/89  soft-tissue]
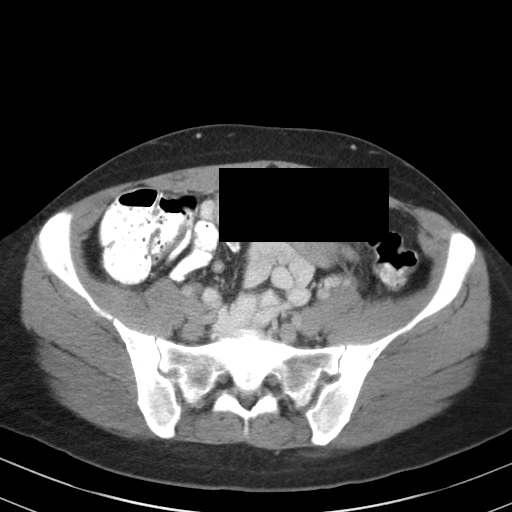
[im 39/89  soft-tissue]
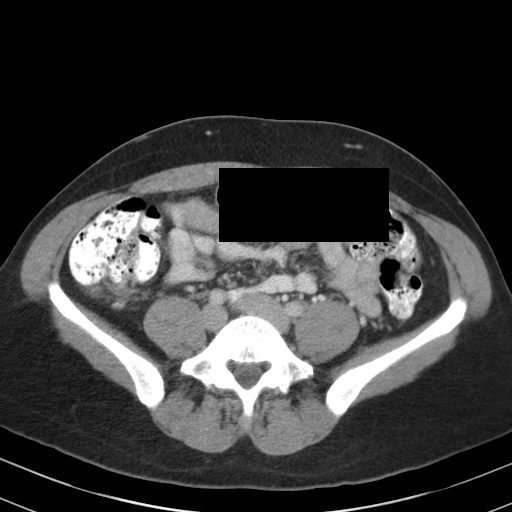
[im 46/89  soft-tissue]
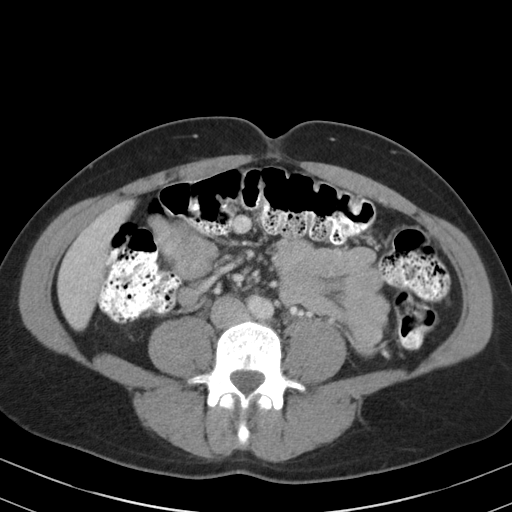
[im 50/89  soft-tissue]
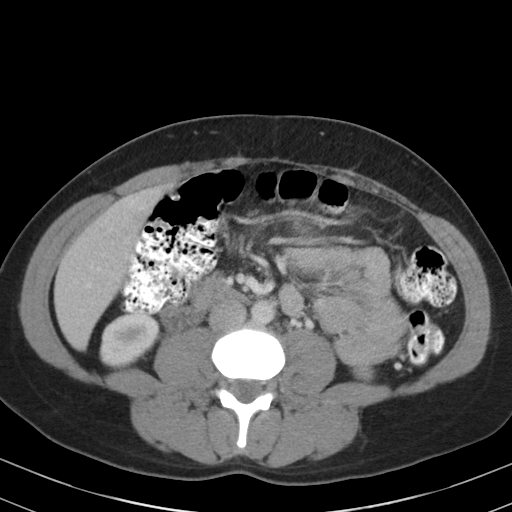
[im 57/89  soft-tissue]
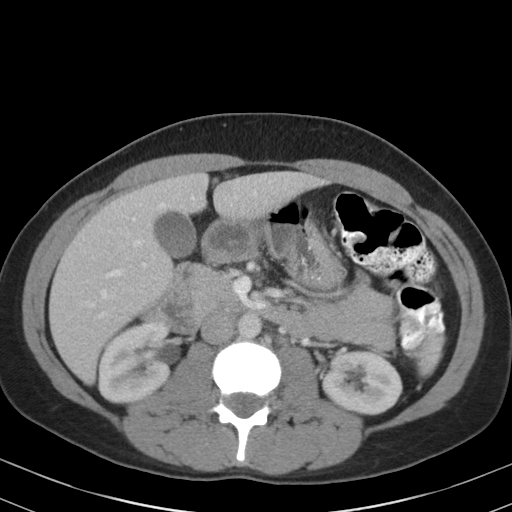
[im 57/89  bone]
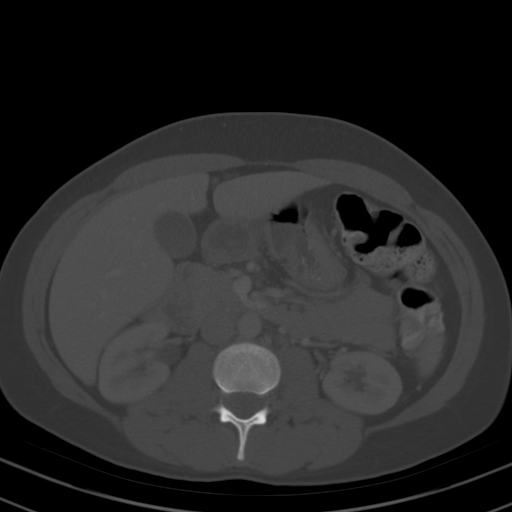
[im 64/89  soft-tissue]
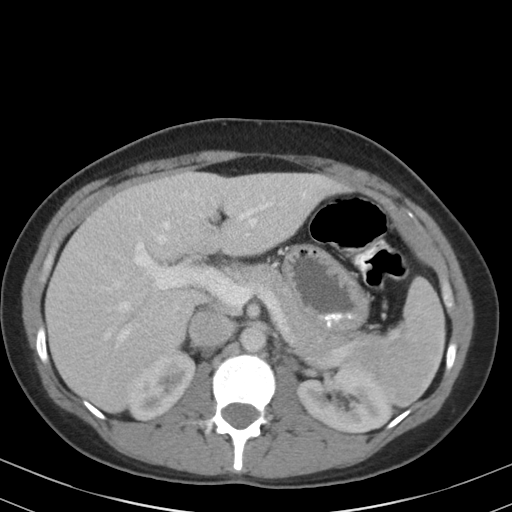
[im 71/89  soft-tissue]
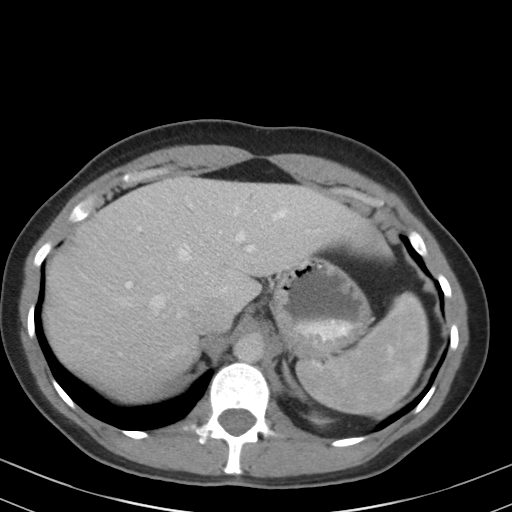
[im 78/89  soft-tissue]
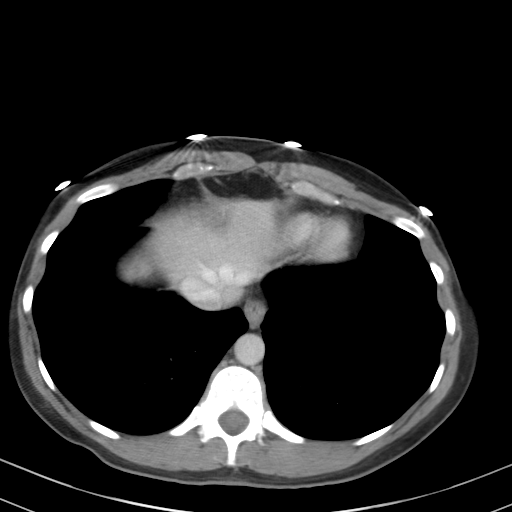
[im 85/89  soft-tissue]
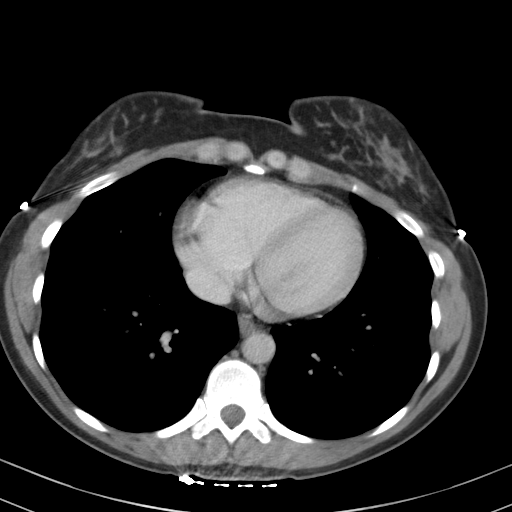

[Series 3: cor · coronal · 0.63mm/px · 3 of 76 slices shown]
[im 26/76  soft-tissue]
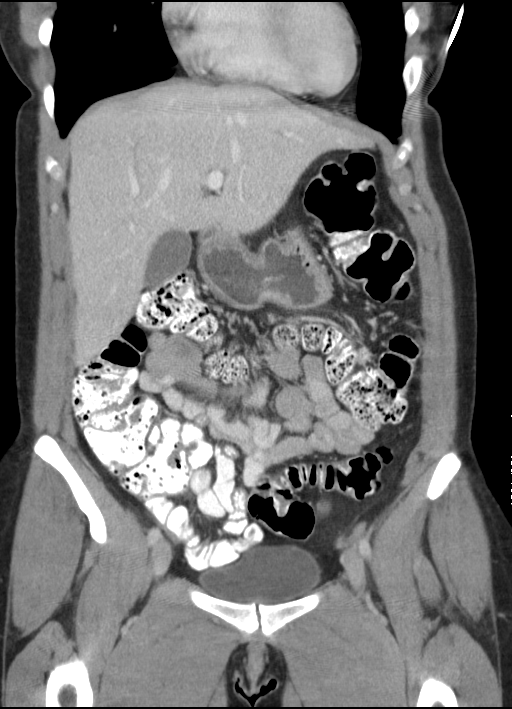
[im 34/76  soft-tissue]
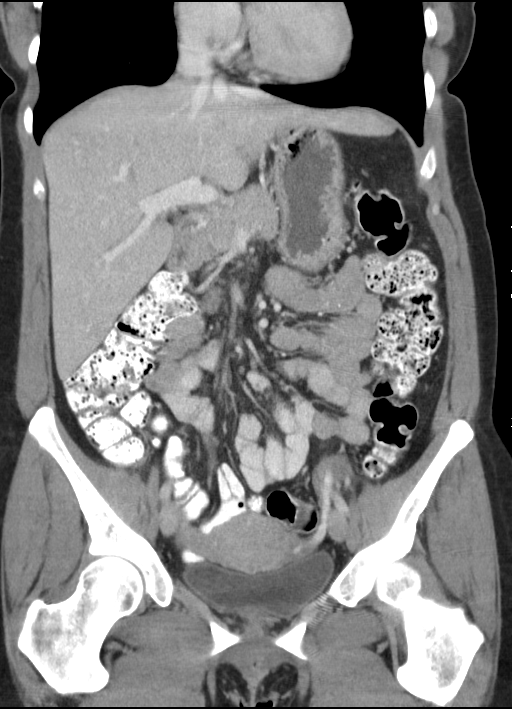
[im 42/76  soft-tissue]
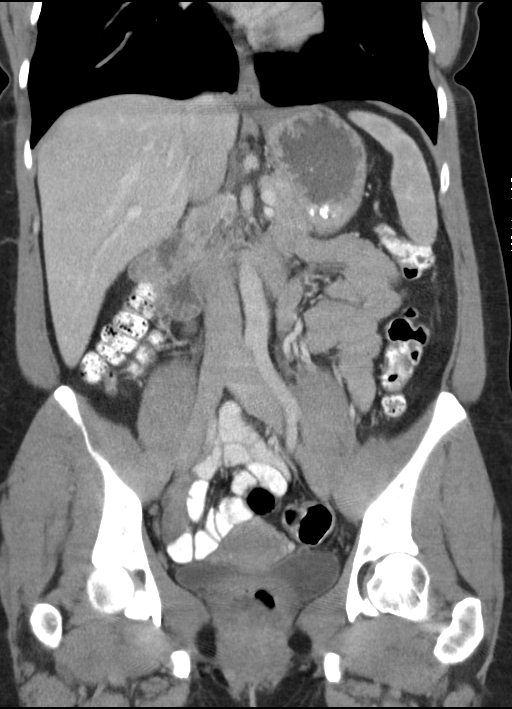

[16 of 46 positions shown; findings below may reference images not displayed]

FINDINGS: Lower chest: 3 mm left lower lobe pulmonary nodule on image
10/series 5 is likely post infectious or inflammatory given patient
age and non smoking history.

Normal heart size without pericardial or pleural effusion.

Hepatobiliary: Focal steatosis adjacent the falciform ligament.
Normal gallbladder, without biliary ductal dilatation.

Pancreas: Normal, without mass or ductal dilatation.

Spleen: Normal in size, without focal abnormality.

Adrenals/Urinary Tract: Normal adrenal glands. Normal kidneys,
without hydronephrosis. Normal urinary bladder.

Stomach/Bowel: Normal stomach, without wall thickening. Normal
colon, appendix, and terminal ileum. Normal small bowel.

Vascular/Lymphatic: Normal caliber of the aorta and branch vessels.
No abdominopelvic adenopathy.

Reproductive: Normal uterus and adnexa.

Other: No significant free fluid.

Musculoskeletal: No acute osseous abnormality.
IMPRESSION: No acute process or explanation for left lower quadrant pain.

## 2017-05-18 ENCOUNTER — Other Ambulatory Visit: Payer: Self-pay | Admitting: Obstetrics & Gynecology

## 2017-05-19 NOTE — Telephone Encounter (Signed)
Medication refill request: OCP Last AEX:  08-21-15  Last OV: 07-24-16  Next AEX: 05-25-17 Last MMG (if hormonal medication request): N/A Refill authorized: please advise

## 2017-05-25 ENCOUNTER — Other Ambulatory Visit (HOSPITAL_COMMUNITY)
Admission: RE | Admit: 2017-05-25 | Discharge: 2017-05-25 | Disposition: A | Discharge status: Home / Self Care | Payer: 59 | Source: Ambulatory Visit | Attending: Certified Nurse Midwife | Admitting: Certified Nurse Midwife

## 2017-05-25 ENCOUNTER — Ambulatory Visit (INDEPENDENT_AMBULATORY_CARE_PROVIDER_SITE_OTHER): Payer: 59 | Admitting: Certified Nurse Midwife

## 2017-05-25 ENCOUNTER — Encounter: Payer: Self-pay | Admitting: Certified Nurse Midwife

## 2017-05-25 VITALS — BP 110/64 | HR 68 | Resp 16 | Ht 64.75 in | Wt 153.0 lb

## 2017-05-25 DIAGNOSIS — R635 Abnormal weight gain: Secondary | ICD-10-CM

## 2017-05-25 DIAGNOSIS — Z124 Encounter for screening for malignant neoplasm of cervix: Secondary | ICD-10-CM | POA: Diagnosis not present

## 2017-05-25 DIAGNOSIS — Z0000 Encounter for general adult medical examination without abnormal findings: Secondary | ICD-10-CM | POA: Diagnosis not present

## 2017-05-25 DIAGNOSIS — Z30011 Encounter for initial prescription of contraceptive pills: Secondary | ICD-10-CM

## 2017-05-25 DIAGNOSIS — Z01419 Encounter for gynecological examination (general) (routine) without abnormal findings: Secondary | ICD-10-CM | POA: Diagnosis not present

## 2017-05-25 MED ORDER — NORETHINDRONE 0.35 MG PO TABS: 1.0000 | ORAL_TABLET | Freq: Every day | ORAL | 4 refills | Status: DC

## 2017-05-25 NOTE — Patient Instructions (Signed)

## 2017-05-25 NOTE — Progress Notes (Signed)
36 y.o. [id number] Legally Separated  Caucasian Fe here for annual exam. Periods normal, no issues. Sexually active, no STD concerns. Staying on consistent clean diet to try to have less stomach issues. Had been on diet with GI that helped for awhile. Will follow up with GI if needed. Not working on marriage after 3 months of trying again. Has decided that 16 years of this "is enough"., STD screening desired.Currently sexually active using condoms. Would like to be back on on OCP, but had headaches with use the last time. Does not want IUD or Nuvaring,  Prefers OCP. Struggling with weight gain and has noted some heat and cold intolerance.No hair or skin changes. Desires Screening labs. Sees Dr. Paulino RilyWolters prn. No other health issues today.  Patient's last menstrual period was 05/15/2017 (exact date).          Sexually active: Yes.    The current method of family planning is condoms most of the time.    Exercising: Yes.    cardio & weights Smoker:  no  Health Maintenance: Pap:  08-03-13 neg, 08-21-15 neg HPV HR neg History of Abnormal Pap: yes MMG:  2012? Self Breast exams: yes Colonoscopy:  4/17 neg per patient BMD:   none TDaP:  2010 Shingles: no Pneumonia: no Hep C and HIV: not done Labs: none   reports that she has never smoked. She has never used smokeless tobacco. She reports that she drinks about 1.2 oz of alcohol per week . She reports that she does not use drugs.  Past Medical History:  Diagnosis Date  . Abnormal Pap smear    ASCUS +HPV  . Migraines     Past Surgical History:  Procedure Laterality Date  . COLPOSCOPY  2005   neg  . KNEE SURGERY Left   . LAPAROSCOPY N/A 04/21/2016   Procedure: LAPAROSCOPY DIAGNOSTIC;  Surgeon: Jerene BearsMary S Miller, MD;  Location: [facility];  Service: Gynecology;  Laterality: N/A;  Corky, J-plasma rep will be here per Kennon RoundsSally in Dr.'s Office.  Confirmed on 04/17/16  . LYSIS OF ADHESION  04/21/2016   Procedure: LYSIS OF ADHESION;  Surgeon: Jerene BearsMary S Miller, MD;   Location: [facility];  Service: Gynecology;;    Current Outpatient Prescriptions  Medication Sig Dispense Refill  . acetaminophen (TYLENOL) 500 MG tablet Take 1,000 mg by mouth every 6 (six) hours as needed for headache. Reported on 05/13/2016    . b complex vitamins tablet Take 1 tablet by mouth as needed. Reported on 04/20/2016    . ibuprofen (ADVIL,MOTRIN) 200 MG tablet Take 200 mg by mouth.    . Multiple Vitamins-Minerals (MULTIVITAMIN PO) Take by mouth daily. Reported on 05/13/2016    . SUMAtriptan (IMITREX) 50 MG tablet Take 50 mg by mouth every 2 (two) hours as needed for migraine. Reported on 05/13/2016     No current facility-administered medications for this visit.     Family History  Problem Relation Age of Onset  . Cancer Mother        cervical  . Hyperlipidemia Mother   . Stroke Father   . Other Father        pulmonary fibrosis    ROS:  Pertinent items are noted in HPI.  Otherwise, a comprehensive ROS was negative.  Exam:   BP 110/64   Pulse 68   Resp 16   Ht 5' 4.75" (1.645 m)   Wt 153 lb (69.4 kg)   LMP 05/15/2017 (Exact Date)   BMI 25.66 kg/m  Height:  5' 4.75" (164.5 cm) Ht Readings from Last 3 Encounters:  05/25/17 5' 4.75" (1.645 m)  07/24/16 5\' 5"  (1.651 m)  04/20/16 5\' 5"  (1.651 m)    General appearance: alert, cooperative and appears stated age Head: Normocephalic, without obvious abnormality, atraumatic Neck: no adenopathy, supple, symmetrical, trachea midline and thyroid normal to inspection and palpation Lungs: clear to auscultation bilaterally Breasts: normal appearance, no masses or tenderness, No nipple retraction or dimpling, No nipple discharge or bleeding, No axillary or supraclavicular adenopathy Heart: regular rate and rhythm Abdomen: soft, non-tender; no masses,  no organomegaly Extremities: extremities normal, atraumatic, no cyanosis or edema Skin: Skin color, texture, turgor normal. No rashes or lesions Lymph nodes: Cervical,  supraclavicular, and axillary nodes normal. No abnormal inguinal nodes palpated Neurologic: Grossly normal   Pelvic: External genitalia:  no lesions              Urethra:  normal appearing urethra with no masses, tenderness or lesions              Bartholin's and Skene's: normal                 Vagina: normal appearing vagina with normal color and discharge, no lesions              Cervix: multiparous appearance, no cervical motion tenderness and no lesions              Pap taken: Yes.   Bimanual Exam:  Uterus:  normal size, contour, position, consistency, mobility, non-tender              Adnexa: normal adnexa and no mass, fullness, tenderness               Rectovaginal: Confirms               Anus:  normal sphincter tone, no lesions  Chaperone present: yes  A:  Well Woman with normal exam  Contraception condoms, desires OCP again  History of chronic pelvic pain with Adhesions lysis with no change.  Social stress with divorce, but seeing counselor  Screening labs  P:   Reviewed health and wellness pertinent to exam  Discussed risks and benefits of POP, bleeding profile expectations and instructions for use. Patient would like to try.   Rx Camilla see order will call in 3 months with status of headaches  Continue counselor visits and seek friend and family support  Labs: TSH,CMP,Lipid panel, Vitamin D, STD panel, GC,chlamydia, affirm  Pap smear: yes   counseled on breast self exam, STD prevention, HIV risk factors and prevention, adequate intake of calcium and vitamin D, diet and exercise  return annually or prn  An After Visit Summary was printed and given to the patient.

## 2017-05-26 LAB — COMPREHENSIVE METABOLIC PANEL
A/G RATIO: 1.8 (ref 1.2–2.2)
ALT: 13 IU/L (ref 0–32)
AST: 15 IU/L (ref 0–40)
Albumin: 4.4 g/dL (ref 3.5–5.5)
Alkaline Phosphatase: 44 IU/L (ref 39–117)
BUN/Creatinine Ratio: 22 (ref 9–23)
BUN: 17 mg/dL (ref 6–20)
Bilirubin Total: 0.4 mg/dL (ref 0.0–1.2)
CALCIUM: 9.2 mg/dL (ref 8.7–10.2)
CHLORIDE: 100 mmol/L (ref 96–106)
CO2: 26 mmol/L (ref 18–29)
Creatinine, Ser: 0.76 mg/dL (ref 0.57–1.00)
GFR, EST AFRICAN AMERICAN: 116 mL/min/{1.73_m2} (ref >59)
GFR, EST NON AFRICAN AMERICAN: 101 mL/min/{1.73_m2} (ref >59)
GLUCOSE: 88 mg/dL (ref 65–99)
Globulin, Total: 2.4 g/dL (ref 1.5–4.5)
POTASSIUM: 4.4 mmol/L (ref 3.5–5.2)
Sodium: 140 mmol/L (ref 134–144)
TOTAL PROTEIN: 6.8 g/dL (ref 6.0–8.5)

## 2017-05-26 LAB — LIPID PANEL
CHOL/HDL RATIO: 3.2 ratio (ref 0.0–4.4)
Cholesterol, Total: 187 mg/dL (ref 100–199)
HDL: 58 mg/dL (ref >39)
LDL Calculated: 92 mg/dL (ref 0–99)
Triglycerides: 186 mg/dL — ABNORMAL HIGH (ref 0–149)
VLDL Cholesterol Cal: 37 mg/dL (ref 5–40)

## 2017-05-26 LAB — HEP, RPR, HIV PANEL
HIV SCREEN 4TH GENERATION: NONREACTIVE
Hepatitis B Surface Ag: NEGATIVE
RPR: NONREACTIVE

## 2017-05-26 LAB — VITAMIN D 25 HYDROXY (VIT D DEFICIENCY, FRACTURES): VIT D 25 HYDROXY: 28.6 ng/mL — AB (ref 30.0–100.0)

## 2017-05-26 LAB — TSH: TSH: 1.77 u[IU]/mL (ref 0.450–4.500)

## 2017-05-26 LAB — HEPATITIS C ANTIBODY

## 2017-05-27 LAB — CYTOLOGY - PAP
CHLAMYDIA, DNA PROBE: NEGATIVE
DIAGNOSIS: NEGATIVE
Neisseria Gonorrhea: NEGATIVE

## 2017-05-27 LAB — VAGINITIS/VAGINOSIS, DNA PROBE
Candida Species: NEGATIVE
Gardnerella vaginalis: NEGATIVE
Trichomonas vaginosis: NEGATIVE

## 2017-07-02 DIAGNOSIS — R5383 Other fatigue: Secondary | ICD-10-CM | POA: Diagnosis not present

## 2017-07-02 DIAGNOSIS — Z7689 Persons encountering health services in other specified circumstances: Secondary | ICD-10-CM | POA: Diagnosis not present

## 2017-07-02 DIAGNOSIS — T63481A Toxic effect of venom of other arthropod, accidental (unintentional), initial encounter: Secondary | ICD-10-CM | POA: Diagnosis not present

## 2017-07-02 DIAGNOSIS — W57XXXA Bitten or stung by nonvenomous insect and other nonvenomous arthropods, initial encounter: Secondary | ICD-10-CM | POA: Diagnosis not present

## 2017-07-23 ENCOUNTER — Other Ambulatory Visit: Payer: Self-pay | Admitting: Certified Nurse Midwife

## 2017-07-29 ENCOUNTER — Other Ambulatory Visit: Payer: Self-pay | Admitting: Certified Nurse Midwife

## 2017-07-29 NOTE — Telephone Encounter (Signed)
Patient would like to speak with nurse about her birth control prescription. °

## 2017-07-29 NOTE — Telephone Encounter (Signed)
Medication refill request: junel 1.5/30 Last AEX:  05-25-17 Next AEX: 06-01-18 Last MMG (if hormonal medication request): none Refill authorized: pt no longer on this. rx denied.

## 2017-07-30 ENCOUNTER — Other Ambulatory Visit: Payer: Self-pay | Admitting: Obstetrics & Gynecology

## 2017-07-30 DIAGNOSIS — Z30011 Encounter for initial prescription of contraceptive pills: Secondary | ICD-10-CM

## 2017-07-30 MED ORDER — NORETHINDRONE 0.35 MG PO TABS: 1.0000 | ORAL_TABLET | Freq: Every day | ORAL | 0 refills | Status: AC

## 2017-07-30 NOTE — Telephone Encounter (Signed)
Spoke with patient. Patient states she never switched from Junel to POP as recommended. Patient states she continued with what the pharmacy provider her, thought it was the new pill because it looked a little different. Patient states she was changing d/t BTB that she was experiencing, states that has improved. Per review of OV notes dated 05/25/17, patient experienced HA on OCP in the past, was to try POP and return call in 3 months with update. LMP 07/26/17, out of OCP, d/t restart on Sunday.  Patient requesting refill of Junel 1.5/20.   Reports 3 migraines a month with sensitivity to light and nausea. States stress and hydration related. HA before menses, see PCP for management.   Advised patient Leota SauersDeborah Leonard, CNM is out of the office, will review with covering provider and return call with recommendations, patient is agreeable.  Dr. Hyacinth MeekerMiller, please review and advise?  Cc: Leota Sauerseborah Leonard, CNM

## 2017-07-30 NOTE — Telephone Encounter (Signed)
Left message to call Javion Holmer at [phone].  

## 2017-07-30 NOTE — Telephone Encounter (Signed)
Called pt.  No answer.  Reviewed DPR.  Detailed message left.  Would recommend changing to POP.  Rx for Petrolia[facility]illa sent to [facility] on [address]  #3 packages/0RF.  Advised to call back if has concerns.

## 2017-08-02 ENCOUNTER — Telehealth: Payer: Self-pay | Admitting: Certified Nurse Midwife

## 2017-08-02 NOTE — Telephone Encounter (Signed)
Reviewed with Clinical supervisor. OV recommended with provider for further discussion of medication change and evaluation.   Call to patient, patient declined OV and thankful for time.  Routing to provider for final review. Patient is agreeable to disposition. Will close encounter.

## 2017-08-02 NOTE — Telephone Encounter (Signed)
Patient would like to speak with nurse about her birth control. °

## 2017-08-02 NOTE — Telephone Encounter (Signed)
Spoke with patient. Patient states refill request for OCP Junel was denied, received message from Dr. Hyacinth MeekerMiller, POP Kathryn [name] sent instead. See refill encounter dated 07/29/17.   Patient expressed frustration, feels contraceptive should not be changed, Junel is working well, PCP is managing headaches, BTB has improved. Now without OCP for 2 days, requesting RX for Junel. Patient states she is aware of difference in POP and reason for recommendation. Patient states the change was because of BTB, [facility]  Recommended OV for further discussion with provider, patient declined. Patient states she does not feel OV necessary for a medication that has been working well for her.  Advised patient would update Dr. Hyacinth MeekerMiller and return call with any additional recommendations. Patient agreeable.    Dr. Lesli AlbeeMiller-please review.  Cc: Kathryn Sauerseborah [name], CNM

## 2017-09-22 DIAGNOSIS — Z3041 Encounter for surveillance of contraceptive pills: Secondary | ICD-10-CM | POA: Diagnosis not present

## 2017-11-09 DIAGNOSIS — J0140 Acute pansinusitis, unspecified: Secondary | ICD-10-CM | POA: Diagnosis not present

## 2017-11-09 DIAGNOSIS — J069 Acute upper respiratory infection, unspecified: Secondary | ICD-10-CM | POA: Diagnosis not present

## 2018-02-02 DIAGNOSIS — M2011 Hallux valgus (acquired), right foot: Secondary | ICD-10-CM | POA: Diagnosis not present

## 2018-02-02 DIAGNOSIS — M2012 Hallux valgus (acquired), left foot: Secondary | ICD-10-CM | POA: Diagnosis not present

## 2018-02-10 DIAGNOSIS — G43909 Migraine, unspecified, not intractable, without status migrainosus: Secondary | ICD-10-CM | POA: Diagnosis not present

## 2018-02-10 DIAGNOSIS — J329 Chronic sinusitis, unspecified: Secondary | ICD-10-CM | POA: Diagnosis not present

## 2018-05-26 DIAGNOSIS — Z01419 Encounter for gynecological examination (general) (routine) without abnormal findings: Secondary | ICD-10-CM | POA: Diagnosis not present

## 2018-06-01 ENCOUNTER — Encounter: Payer: Self-pay | Admitting: Certified Nurse Midwife

## 2018-06-01 ENCOUNTER — Ambulatory Visit: Payer: BLUE CROSS/BLUE SHIELD | Admitting: Certified Nurse Midwife

## 2018-06-01 NOTE — Progress Notes (Deleted)
37 y.o. Kathryn [name] Legally Separated  {Race/ethnicity:17218} Fe here for annual exam.    No LMP recorded.          Sexually active: {yes no:314532}  The current method of family planning is {contraception:315051}.    Exercising: {yes no:314532}  {types:19826} Smoker:  {YES NO:22349}  Health Maintenance: Pap:  08-21-15 neg HPV HR neg History of Abnormal Pap: {YES NO:22349} MMG:  *** Self Breast exams: {YES NO:22349} Colonoscopy:  4/17 neg per patient BMD:   none TDaP:  2010 Shingles: no Pneumonia: no Hep C and HIV: both neg 2018 Labs: ***   reports that she has never smoked. She has never used smokeless tobacco. She reports that she drinks about 1.2 oz of alcohol per week. She reports that she does not use drugs.  Past Medical History:  Diagnosis Date  . Abnormal Pap smear    ASCUS +HPV  . Migraines     Past Surgical History:  Procedure Laterality Date  . COLPOSCOPY  2005   neg  . KNEE SURGERY Left   . LAPAROSCOPY N/A 04/21/2016   Procedure: LAPAROSCOPY DIAGNOSTIC;  Surgeon: Jerene Bears, MD;  Location: [facility];  Service: Gynecology;  Laterality: N/A;  Corky, J-plasma rep will be here per Kennon Rounds in Dr.'s Office.  Confirmed on 04/17/16  . LYSIS OF ADHESION  04/21/2016   Procedure: LYSIS OF ADHESION;  Surgeon: Jerene Bears, MD;  Location: [facility];  Service: Gynecology;;    Current Outpatient Medications  Medication Sig Dispense Refill  . acetaminophen (TYLENOL) 500 MG tablet Take 1,000 mg by mouth every 6 (six) hours as needed for headache. Reported on 05/13/2016    . b complex vitamins tablet Take 1 tablet by mouth as needed. Reported on 04/20/2016    . ibuprofen (ADVIL,MOTRIN) 200 MG tablet Take 200 mg by mouth.    . Multiple Vitamins-Minerals (MULTIVITAMIN PO) Take by mouth daily. Reported on 05/13/2016    . norethindrone (MICRONOR,CAMILA,ERRIN) 0.35 MG tablet Take 1 tablet (0.35 mg total) by mouth daily. 3 Package 0  . SUMAtriptan (IMITREX) 50 MG tablet Take 50 mg by mouth every 2  (two) hours as needed for migraine. Reported on 05/13/2016     No current facility-administered medications for this visit.     Family History  Problem Relation Age of Onset  . Cancer Mother        cervical  . Hyperlipidemia Mother   . Stroke Father   . Other Father        pulmonary fibrosis    ROS:  Pertinent items are noted in HPI.  Otherwise, a comprehensive ROS was negative.  Exam:   There were no vitals taken for this visit.   Ht Readings from Last 3 Encounters:  05/25/17 5' 4.75" (1.645 m)  07/24/16 5\' 5"  (1.651 m)  04/20/16 5\' 5"  (1.651 m)    General appearance: alert, cooperative and appears stated age Head: Normocephalic, without obvious abnormality, atraumatic Neck: no adenopathy, supple, symmetrical, trachea midline and thyroid {EXAM; THYROID:18604} Lungs: clear to auscultation bilaterally Breasts: {Exam; breast:13139::"normal appearance, no masses or tenderness"} Heart: regular rate and rhythm Abdomen: soft, non-tender; no masses,  no organomegaly Extremities: extremities normal, atraumatic, no cyanosis or edema Skin: Skin color, texture, turgor normal. No rashes or lesions Lymph nodes: Cervical, supraclavicular, and axillary nodes normal. No abnormal inguinal nodes palpated Neurologic: Grossly normal   Pelvic: External genitalia:  no lesions              Urethra:  normal appearing urethra with no masses, tenderness or lesions              Bartholin's and Skene's: normal                 Vagina: normal appearing vagina with normal color and discharge, no lesions              Cervix: {exam; cervix:14595}              Pap taken: {yes no:314532} Bimanual Exam:  Uterus:  {exam; uterus:12215}              Adnexa: {exam; adnexa:12223}               Rectovaginal: Confirms               Anus:  normal sphincter tone, no lesions  Chaperone present: ***  A:  Well Woman with normal exam  P:   Reviewed health and wellness pertinent to exam  Pap smear: {YES  NO:22349}  {plan; gyn:5269::"mammogram","pap smear","return annually or prn"}  An After Visit Summary was printed and given to the patient.

## 2018-08-19 DIAGNOSIS — F329 Major depressive disorder, single episode, unspecified: Secondary | ICD-10-CM | POA: Diagnosis not present

## 2018-09-06 DIAGNOSIS — N898 Other specified noninflammatory disorders of vagina: Secondary | ICD-10-CM | POA: Diagnosis not present

## 2018-09-06 DIAGNOSIS — Z113 Encounter for screening for infections with a predominantly sexual mode of transmission: Secondary | ICD-10-CM | POA: Diagnosis not present

## 2019-05-16 DIAGNOSIS — J309 Allergic rhinitis, unspecified: Secondary | ICD-10-CM | POA: Diagnosis not present

## 2019-05-16 DIAGNOSIS — J0101 Acute recurrent maxillary sinusitis: Secondary | ICD-10-CM | POA: Diagnosis not present

## 2019-06-14 DIAGNOSIS — J019 Acute sinusitis, unspecified: Secondary | ICD-10-CM | POA: Diagnosis not present

## 2019-07-12 DIAGNOSIS — J310 Chronic rhinitis: Secondary | ICD-10-CM | POA: Diagnosis not present

## 2019-07-12 DIAGNOSIS — B9689 Other specified bacterial agents as the cause of diseases classified elsewhere: Secondary | ICD-10-CM | POA: Diagnosis not present

## 2019-07-12 DIAGNOSIS — J019 Acute sinusitis, unspecified: Secondary | ICD-10-CM | POA: Diagnosis not present

## 2019-08-21 DIAGNOSIS — F411 Generalized anxiety disorder: Secondary | ICD-10-CM | POA: Diagnosis not present

## 2019-08-29 DIAGNOSIS — F411 Generalized anxiety disorder: Secondary | ICD-10-CM | POA: Diagnosis not present

## 2019-09-11 DIAGNOSIS — F411 Generalized anxiety disorder: Secondary | ICD-10-CM | POA: Diagnosis not present

## 2019-10-09 DIAGNOSIS — F411 Generalized anxiety disorder: Secondary | ICD-10-CM | POA: Diagnosis not present

## 2019-10-17 DIAGNOSIS — F411 Generalized anxiety disorder: Secondary | ICD-10-CM | POA: Diagnosis not present

## 2019-10-23 DIAGNOSIS — F411 Generalized anxiety disorder: Secondary | ICD-10-CM | POA: Diagnosis not present

## 2019-11-09 DIAGNOSIS — F411 Generalized anxiety disorder: Secondary | ICD-10-CM | POA: Diagnosis not present

## 2019-11-13 ENCOUNTER — Other Ambulatory Visit: Payer: Self-pay

## 2019-11-13 DIAGNOSIS — Z20822 Contact with and (suspected) exposure to covid-19: Secondary | ICD-10-CM

## 2019-11-15 LAB — NOVEL CORONAVIRUS, NAA: SARS-CoV-2, NAA: NOT DETECTED

## 2019-11-23 DIAGNOSIS — F411 Generalized anxiety disorder: Secondary | ICD-10-CM | POA: Diagnosis not present

## 2019-12-07 DIAGNOSIS — F411 Generalized anxiety disorder: Secondary | ICD-10-CM | POA: Diagnosis not present

## 2019-12-19 ENCOUNTER — Ambulatory Visit: Payer: BC Managed Care – PPO | Attending: Internal Medicine

## 2019-12-19 DIAGNOSIS — Z20828 Contact with and (suspected) exposure to other viral communicable diseases: Secondary | ICD-10-CM | POA: Diagnosis not present

## 2019-12-19 DIAGNOSIS — Z20822 Contact with and (suspected) exposure to covid-19: Secondary | ICD-10-CM

## 2019-12-20 LAB — NOVEL CORONAVIRUS, NAA: SARS-CoV-2, NAA: NOT DETECTED

## 2019-12-29 DIAGNOSIS — F411 Generalized anxiety disorder: Secondary | ICD-10-CM | POA: Diagnosis not present

## 2020-01-03 DIAGNOSIS — G245 Blepharospasm: Secondary | ICD-10-CM | POA: Diagnosis not present

## 2020-01-03 DIAGNOSIS — Z91048 Other nonmedicinal substance allergy status: Secondary | ICD-10-CM | POA: Diagnosis not present

## 2020-01-22 DIAGNOSIS — R519 Headache, unspecified: Secondary | ICD-10-CM | POA: Diagnosis not present

## 2020-01-22 DIAGNOSIS — Z20828 Contact with and (suspected) exposure to other viral communicable diseases: Secondary | ICD-10-CM | POA: Diagnosis not present

## 2020-02-16 ENCOUNTER — Ambulatory Visit: Payer: BC Managed Care – PPO | Attending: Internal Medicine

## 2020-02-16 DIAGNOSIS — Z23 Encounter for immunization: Secondary | ICD-10-CM | POA: Insufficient documentation

## 2020-02-16 NOTE — Progress Notes (Signed)
   Covid-19 Vaccination Clinic  Name:  Kathryn [name]    MRN: [id number] DOB: Nov 22, 1981  02/16/2020  [name] was observed post Covid-19 immunization for 15 minutes without incidence. She was provided with Vaccine Information Sheet and instruction to access the V-Safe system.   [name] was instructed to call 911 with any severe reactions post vaccine: Marland Kitchen Difficulty breathing  . Swelling of your face and throat  . A fast heartbeat  . A bad rash all over your body  . Dizziness and weakness    Immunizations Administered    Name Date Dose VIS Date Route   Pfizer COVID-19 Vaccine 02/16/2020 12:51 PM 0.3 mL 12/01/2019 Intramuscular   Manufacturer: [employer], [employer]   Lot: [id number]   NDC: [address]-4

## 2020-03-08 ENCOUNTER — Encounter: Payer: Self-pay | Admitting: Certified Nurse Midwife

## 2020-03-12 ENCOUNTER — Ambulatory Visit: Payer: Commercial Managed Care - HMO | Attending: Internal Medicine

## 2020-03-12 DIAGNOSIS — Z23 Encounter for immunization: Secondary | ICD-10-CM

## 2020-03-12 NOTE — Progress Notes (Signed)
   Covid-19 Vaccination Clinic  Name:  Kathryn [name]    MRN: [id number] DOB: 1981-04-26  03/12/2020  [name] was observed post Covid-19 immunization for 15 minutes without incident. She was provided with Vaccine Information Sheet and instruction to access the V-Safe system.   [name] was instructed to call 911 with any severe reactions post vaccine: Marland Kitchen Difficulty breathing  . Swelling of face and throat  . A fast heartbeat  . A bad rash all over body  . Dizziness and weakness   Immunizations Administered    Name Date Dose VIS Date Route   Pfizer COVID-19 Vaccine 03/12/2020  4:05 PM 0.3 mL 12/01/2019 Intramuscular   Manufacturer: [employer], [employer]   Lot: [id number]   NDC: [address]-5

## 2020-09-06 ENCOUNTER — Other Ambulatory Visit: Payer: Self-pay | Admitting: Obstetrics & Gynecology

## 2020-09-06 DIAGNOSIS — Z1231 Encounter for screening mammogram for malignant neoplasm of breast: Secondary | ICD-10-CM

## 2020-09-20 ENCOUNTER — Ambulatory Visit
Admission: RE | Admit: 2020-09-20 | Discharge: 2020-09-20 | Disposition: A | Discharge status: Home / Self Care | Payer: Commercial Managed Care - HMO | Source: Ambulatory Visit | Attending: Obstetrics & Gynecology | Admitting: Obstetrics & Gynecology

## 2020-09-20 ENCOUNTER — Other Ambulatory Visit: Payer: Self-pay

## 2020-09-20 DIAGNOSIS — Z1231 Encounter for screening mammogram for malignant neoplasm of breast: Secondary | ICD-10-CM

## 2020-09-25 ENCOUNTER — Other Ambulatory Visit: Payer: Self-pay | Admitting: Obstetrics & Gynecology

## 2020-09-25 DIAGNOSIS — R928 Other abnormal and inconclusive findings on diagnostic imaging of breast: Secondary | ICD-10-CM

## 2020-10-11 ENCOUNTER — Ambulatory Visit
Admission: RE | Admit: 2020-10-11 | Discharge: 2020-10-11 | Disposition: A | Discharge status: Home / Self Care | Payer: 59 | Source: Ambulatory Visit | Attending: Obstetrics & Gynecology | Admitting: Obstetrics & Gynecology

## 2020-10-11 ENCOUNTER — Ambulatory Visit: Payer: 59

## 2020-10-11 ENCOUNTER — Other Ambulatory Visit: Payer: Self-pay

## 2020-10-11 DIAGNOSIS — R928 Other abnormal and inconclusive findings on diagnostic imaging of breast: Secondary | ICD-10-CM

## 2020-12-23 ENCOUNTER — Ambulatory Visit: Payer: 59 | Admitting: Dermatology

## 2021-11-27 ENCOUNTER — Institutional Professional Consult (permissible substitution): Payer: 59 | Admitting: Pulmonary Disease

## 2022-01-12 ENCOUNTER — Other Ambulatory Visit: Payer: Self-pay | Admitting: Family Medicine

## 2022-01-12 DIAGNOSIS — Z1231 Encounter for screening mammogram for malignant neoplasm of breast: Secondary | ICD-10-CM

## 2022-01-13 ENCOUNTER — Ambulatory Visit
Admission: RE | Admit: 2022-01-13 | Discharge: 2022-01-13 | Disposition: A | Discharge status: Home / Self Care | Payer: 59 | Source: Ambulatory Visit | Attending: Family Medicine | Admitting: Family Medicine

## 2022-01-13 ENCOUNTER — Other Ambulatory Visit: Payer: Self-pay

## 2022-01-13 DIAGNOSIS — Z1231 Encounter for screening mammogram for malignant neoplasm of breast: Secondary | ICD-10-CM

## 2022-01-14 ENCOUNTER — Other Ambulatory Visit: Payer: Self-pay | Admitting: Family Medicine

## 2022-01-14 DIAGNOSIS — R928 Other abnormal and inconclusive findings on diagnostic imaging of breast: Secondary | ICD-10-CM

## 2022-02-11 ENCOUNTER — Ambulatory Visit
Admission: RE | Admit: 2022-02-11 | Discharge: 2022-02-11 | Disposition: A | Discharge status: Home / Self Care | Payer: 59 | Source: Ambulatory Visit | Attending: Family Medicine | Admitting: Family Medicine

## 2022-02-11 ENCOUNTER — Other Ambulatory Visit: Payer: Self-pay

## 2022-02-11 DIAGNOSIS — R928 Other abnormal and inconclusive findings on diagnostic imaging of breast: Secondary | ICD-10-CM

## 2022-02-13 ENCOUNTER — Other Ambulatory Visit: Payer: Self-pay | Admitting: *Deleted

## 2022-02-13 DIAGNOSIS — R053 Chronic cough: Secondary | ICD-10-CM

## 2022-02-13 DIAGNOSIS — U099 Post covid-19 condition, unspecified: Secondary | ICD-10-CM

## 2022-02-16 ENCOUNTER — Other Ambulatory Visit: Payer: Self-pay

## 2022-02-16 ENCOUNTER — Ambulatory Visit (INDEPENDENT_AMBULATORY_CARE_PROVIDER_SITE_OTHER): Payer: 59 | Admitting: Internal Medicine

## 2022-02-16 DIAGNOSIS — R053 Chronic cough: Secondary | ICD-10-CM | POA: Diagnosis not present

## 2022-02-16 DIAGNOSIS — U099 Post covid-19 condition, unspecified: Secondary | ICD-10-CM | POA: Diagnosis not present

## 2022-02-16 LAB — PULMONARY FUNCTION TEST
DL/VA % pred: 138 %
DL/VA: 6 ml/min/mmHg/L
DLCO cor % pred: 104 %
DLCO cor: 24.27 ml/min/mmHg
DLCO unc % pred: 104 %
DLCO unc: 24.27 ml/min/mmHg
FEF 25-75 Post: 2.63 L/sec
FEF 25-75 Pre: 1.99 L/sec
FEF2575-%Change-Post: 31 %
FEF2575-%Pred-Post: 82 %
FEF2575-%Pred-Pre: 62 %
FEV1-%Change-Post: 14 %
FEV1-%Pred-Post: 74 %
FEV1-%Pred-Pre: 65 %
FEV1-Post: 2.37 L
FEV1-Pre: 2.06 L
FEV1FVC-%Change-Post: 0 %
FEV1FVC-%Pred-Pre: 97 %
FEV6-%Change-Post: 15 %
FEV6-%Pred-Post: 77 %
FEV6-%Pred-Pre: 67 %
FEV6-Post: 2.98 L
FEV6-Pre: 2.59 L
FEV6FVC-%Pred-Post: 102 %
FEV6FVC-%Pred-Pre: 102 %
FVC-%Change-Post: 15 %
FVC-%Pred-Post: 76 %
FVC-%Pred-Pre: 66 %
FVC-Post: 2.98 L
FVC-Pre: 2.59 L
Post FEV1/FVC ratio: 79 %
Post FEV6/FVC ratio: 100 %
Pre FEV1/FVC ratio: 80 %
Pre FEV6/FVC Ratio: 100 %
RV % pred: 100 %
RV: 1.73 L
TLC % pred: 82 %
TLC: 4.42 L

## 2022-02-16 NOTE — Patient Instructions (Signed)
Full PFT performed today. °

## 2022-02-16 NOTE — Progress Notes (Signed)
Full PFT performed today. °

## 2022-02-20 ENCOUNTER — Ambulatory Visit (HOSPITAL_BASED_OUTPATIENT_CLINIC_OR_DEPARTMENT_OTHER)
Admission: RE | Admit: 2022-02-20 | Discharge: 2022-02-20 | Disposition: A | Discharge status: Home / Self Care | Payer: Managed Care, Other (non HMO) | Source: Ambulatory Visit | Attending: Family Medicine | Admitting: Family Medicine

## 2022-02-20 ENCOUNTER — Other Ambulatory Visit (HOSPITAL_BASED_OUTPATIENT_CLINIC_OR_DEPARTMENT_OTHER): Payer: Self-pay | Admitting: Family Medicine

## 2022-02-20 ENCOUNTER — Other Ambulatory Visit: Payer: Self-pay

## 2022-02-20 DIAGNOSIS — R1032 Left lower quadrant pain: Secondary | ICD-10-CM | POA: Diagnosis present

## 2022-02-20 MED ORDER — IOHEXOL 300 MG/ML  SOLN
100.0000 mL | Freq: Once | INTRAMUSCULAR | Status: AC | PRN
  Administered 2022-02-20: 100 mL via INTRAVENOUS

## 2022-03-04 ENCOUNTER — Encounter: Payer: Self-pay | Admitting: Dermatology

## 2022-03-04 ENCOUNTER — Other Ambulatory Visit: Payer: Self-pay

## 2022-03-04 ENCOUNTER — Ambulatory Visit: Payer: Managed Care, Other (non HMO) | Admitting: Dermatology

## 2022-03-04 DIAGNOSIS — Z1283 Encounter for screening for malignant neoplasm of skin: Secondary | ICD-10-CM

## 2022-03-04 NOTE — Patient Instructions (Addendum)
Over the counter hydroquinone if you can find it is a fade cream that you must also use sunscreen SPF #30+ ?

## 2022-03-15 ENCOUNTER — Encounter: Payer: Self-pay | Admitting: Dermatology

## 2022-03-15 NOTE — Progress Notes (Signed)
? ?  New Patient ?  ?Subjective  ?Kathryn [name] is a 41 y.o. female who presents for the following: Annual Exam (Here for annual exam. Pt had a bump on eyelid that is now gone. No personal h/o of atypia or skin cancer ). ? ?General skin examination, growth over eyelid seem to spontaneously go away.  History of tanning bed use. ?Location:  ?Duration:  ?Quality:  ?Associated Signs/Symptoms: ?Modifying Factors:  ?Severity:  ?Timing: ?Context:  ? ? ?The following portions of the chart were reviewed this encounter and updated as appropriate:  Tobacco  Allergies  Meds  Problems  Med Hx  Surg Hx  Fam Hx   ?  ? ?Objective  ?Well appearing patient in no apparent distress; mood and affect are within normal limits. ?No atypical nevi or signs of NMSC noted at the time of the visit. Angioma on the torso and the left breast she got this when breastfeeding; also angioma left eye lid.  History of self resolving eyelid growth could indicate a possible skin tag (not present today). ? ? ? ?A full examination was performed including scalp, head, eyes, ears, nose, lips, neck, chest, axillae, abdomen, back, buttocks, bilateral upper extremities, bilateral lower extremities, hands, feet, fingers, toes, fingernails, and toenails. All findings within normal limits unless otherwise noted below.  Areas beneath undergarments not fully examined. ? ? ?Assessment & Plan  ?Screening exam for skin cancer ? ?Keep yearly skin exams (personal tanning bed history).  ? ? ?

## 2023-03-17 IMAGING — MG MM DIGITAL DIAGNOSTIC UNILAT*R* W/ TOMO W/ CAD
4 series · 4 of 12 positions shown · non-contrast
Comparison: Previous exam(s).

CLINICAL DATA: 40-year-old female for further evaluation of
possible RIGHT breast mass on screening mammogram.

EXAM:
DIGITAL DIAGNOSTIC UNILATERAL RIGHT MAMMOGRAM WITH TOMOSYNTHESIS AND
CAD; ULTRASOUND RIGHT BREAST LIMITED
TECHNIQUE: Right digital diagnostic mammography and breast tomosynthesis was
performed. The images were evaluated with computer-aided detection.;
Targeted ultrasound examination of the right breast was performed

[R MLO synth-2D]
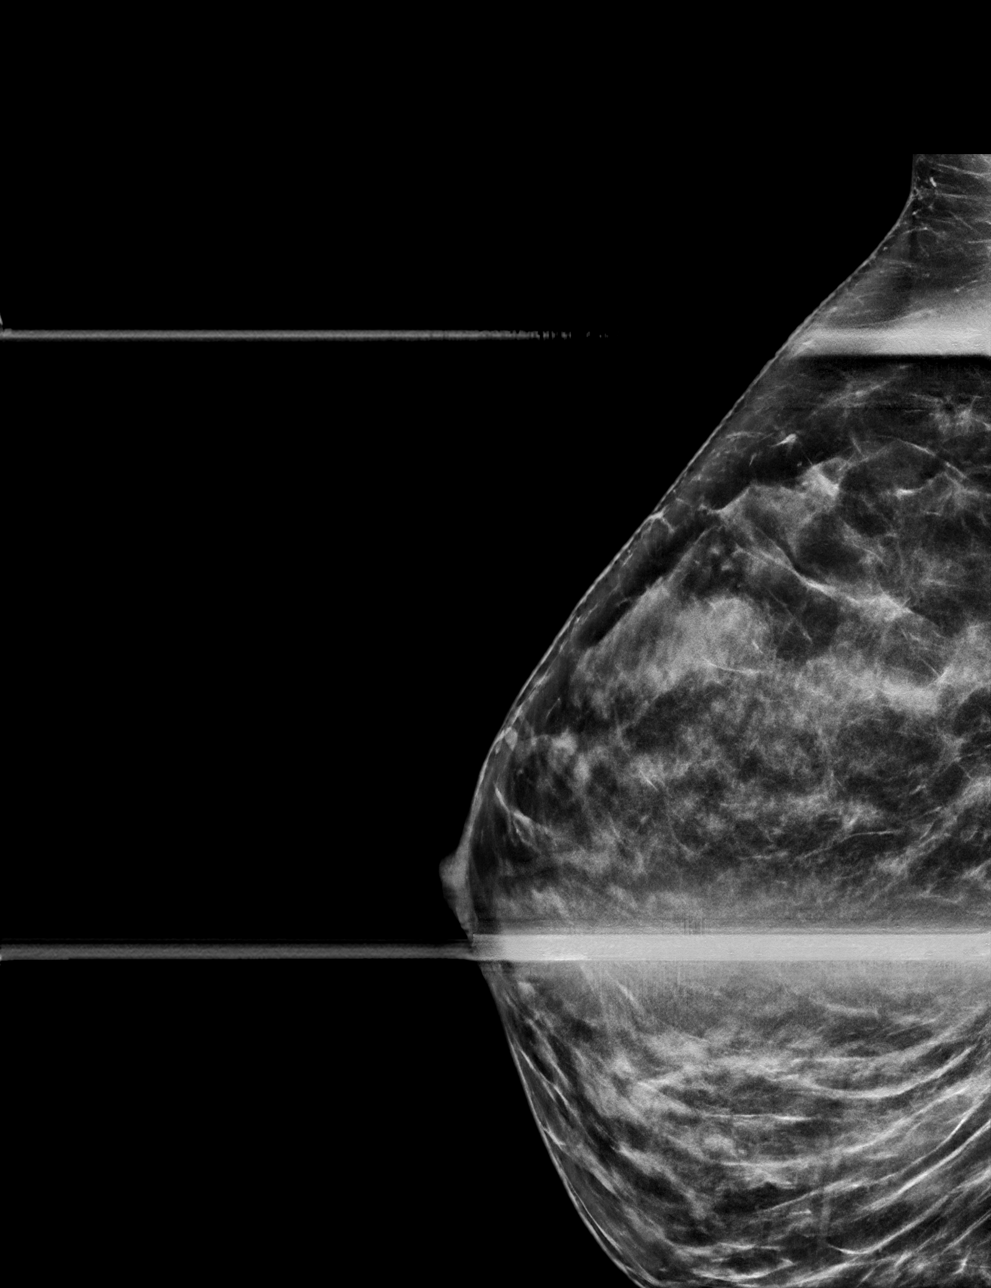

[R ML synth-2D]
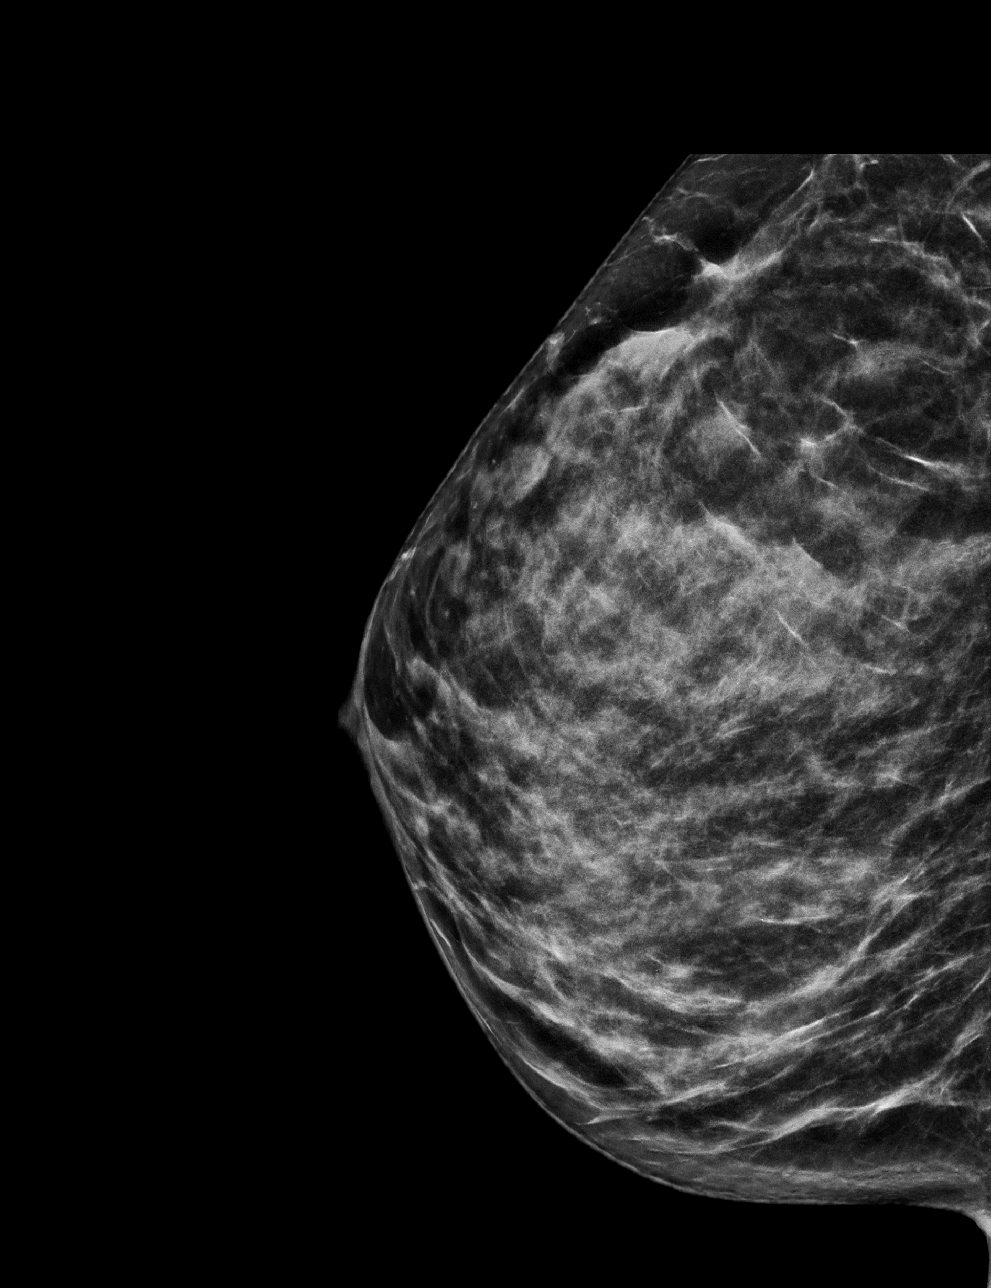

[R ML tomo · tomo slice 29/56.0]
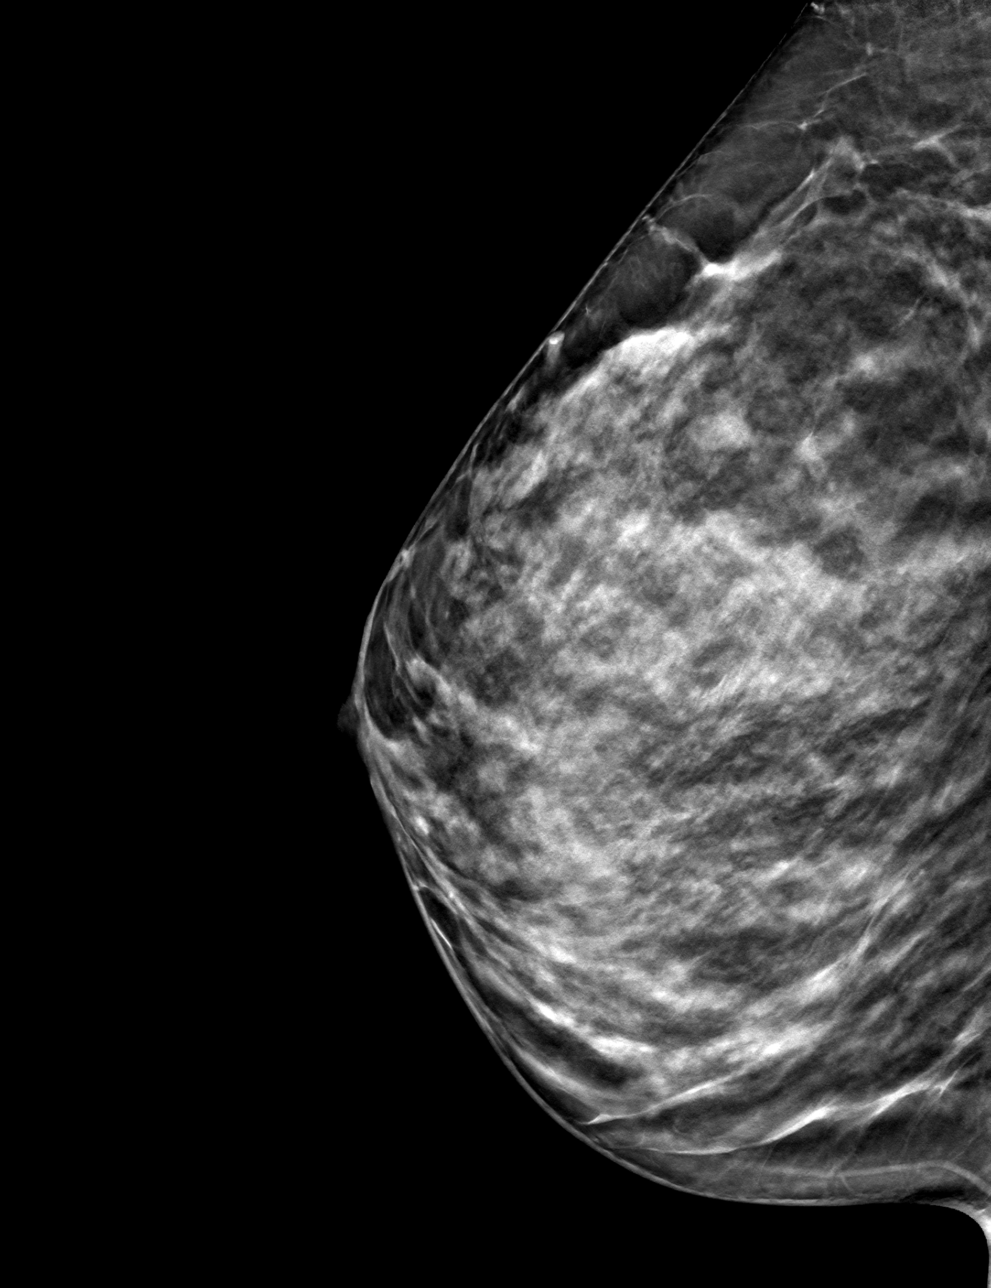

[R MLO tomo · tomo slice 32/63.0]
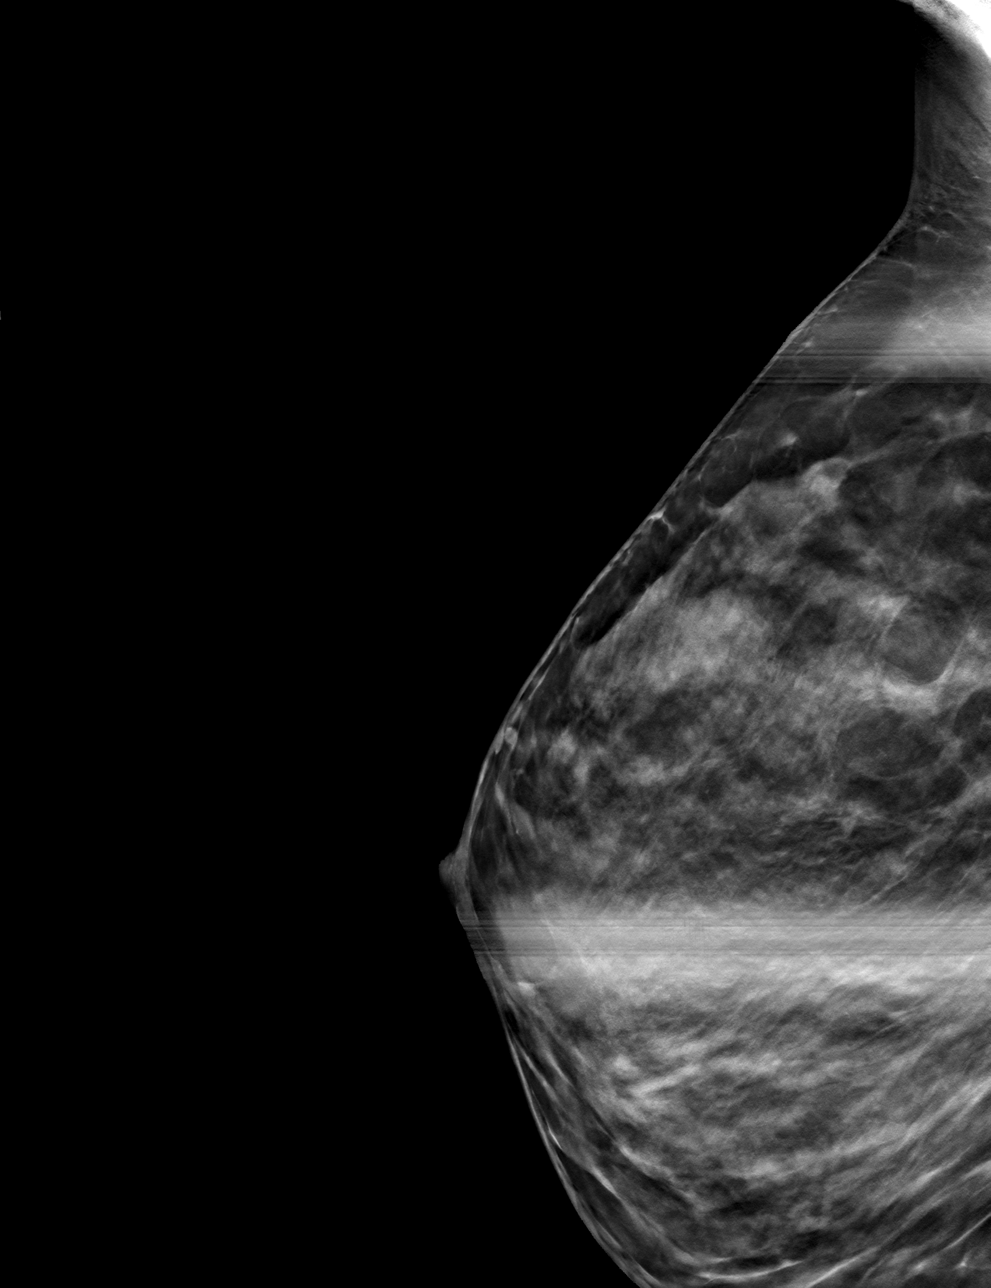

[4 of 12 positions shown; findings below may reference images not displayed]

ACR Breast Density Category c: The breast tissue is heterogeneously
dense, which may obscure small masses.
FINDINGS: 2D/3D full field and spot compression views of the RIGHT breast
demonstrate a persistent circumscribed oval mass within the UPPER
OUTER RIGHT breast.

Targeted ultrasound is performed, showing a 1.6 x 0.9 x 2 cm benign
simple cyst at the 10 o'clock position of the RIGHT breast 5 cm from
the nipple, corresponding to the screening study finding.
IMPRESSION: Benign cyst within the UPPER-OUTER RIGHT breast corresponding to the
screening study finding.

RECOMMENDATION:
Bilateral screening mammogram in 1 year.

I have discussed the findings and recommendations with the patient.
If applicable, a reminder letter will be sent to the patient
regarding the next appointment.

BI-RADS CATEGORY  2: Benign.

## 2023-03-17 IMAGING — US US BREAST*R* LIMITED INC AXILLA
1 series · 5 of 5 positions shown · non-contrast
Comparison: Previous exam(s).

CLINICAL DATA: 40-year-old female for further evaluation of
possible RIGHT breast mass on screening mammogram.

EXAM:
DIGITAL DIAGNOSTIC UNILATERAL RIGHT MAMMOGRAM WITH TOMOSYNTHESIS AND
CAD; ULTRASOUND RIGHT BREAST LIMITED
TECHNIQUE: Right digital diagnostic mammography and breast tomosynthesis was
performed. The images were evaluated with computer-aided detection.;
Targeted ultrasound examination of the right breast was performed

[Series 1: us breast*right* limited inc axilla · 0.06mm/px · 5 of 5 slices shown]
[im 1/5]
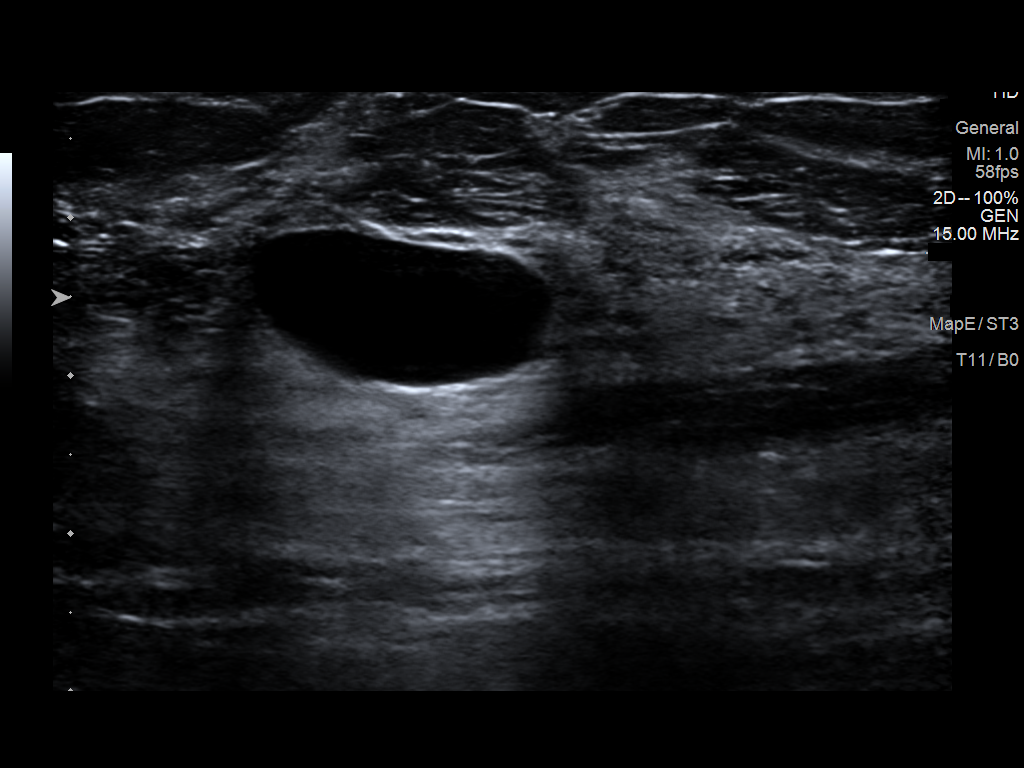
[im 2/5]
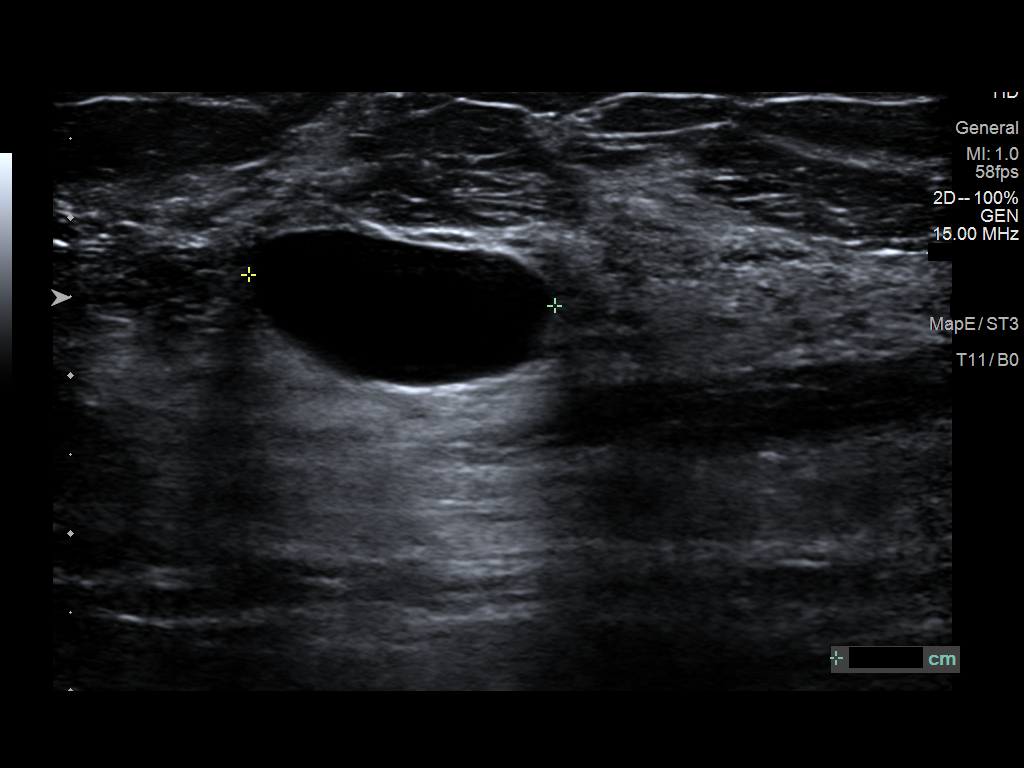
[im 3/5]
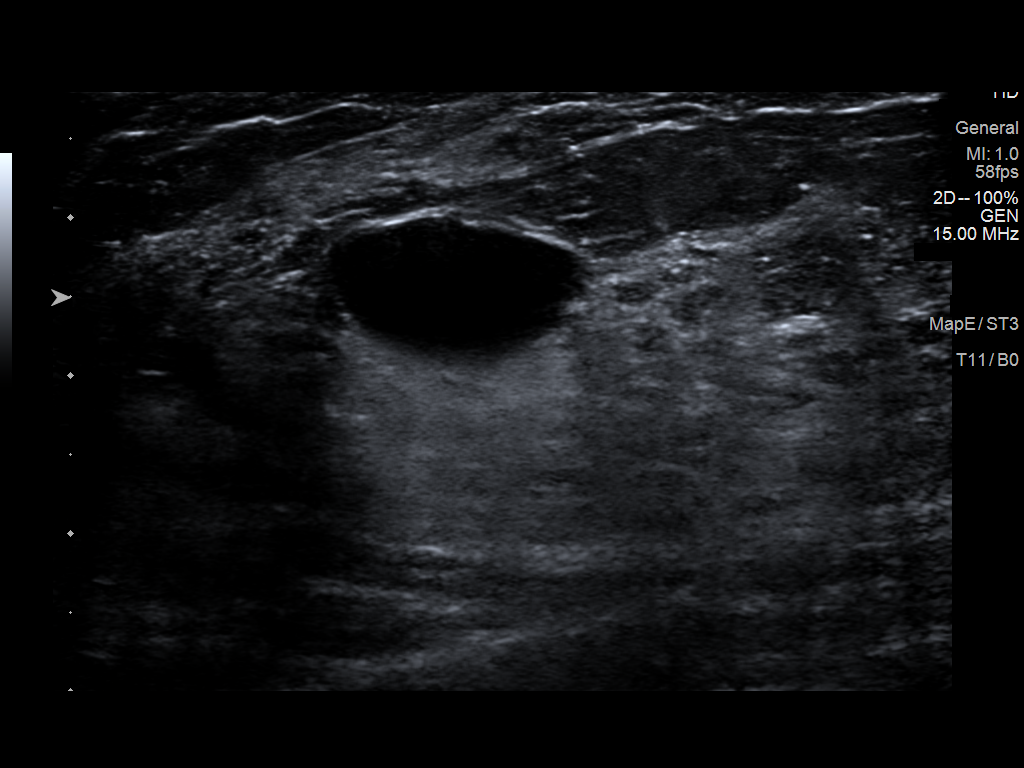
[im 4/5]
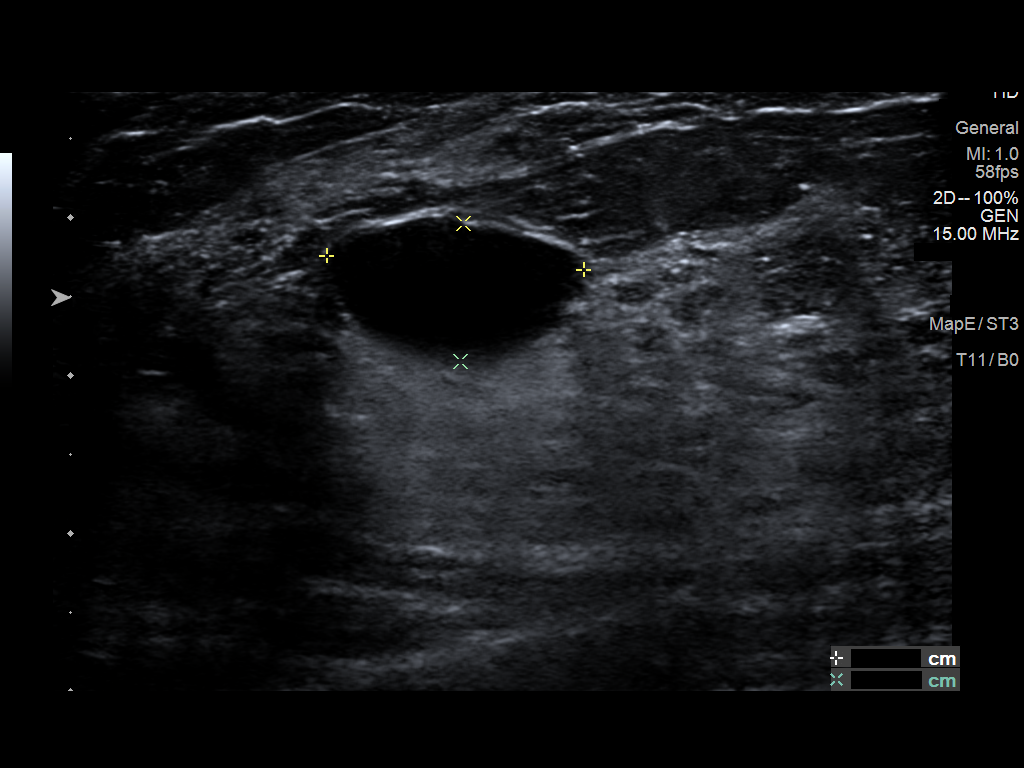
[im 5/5]
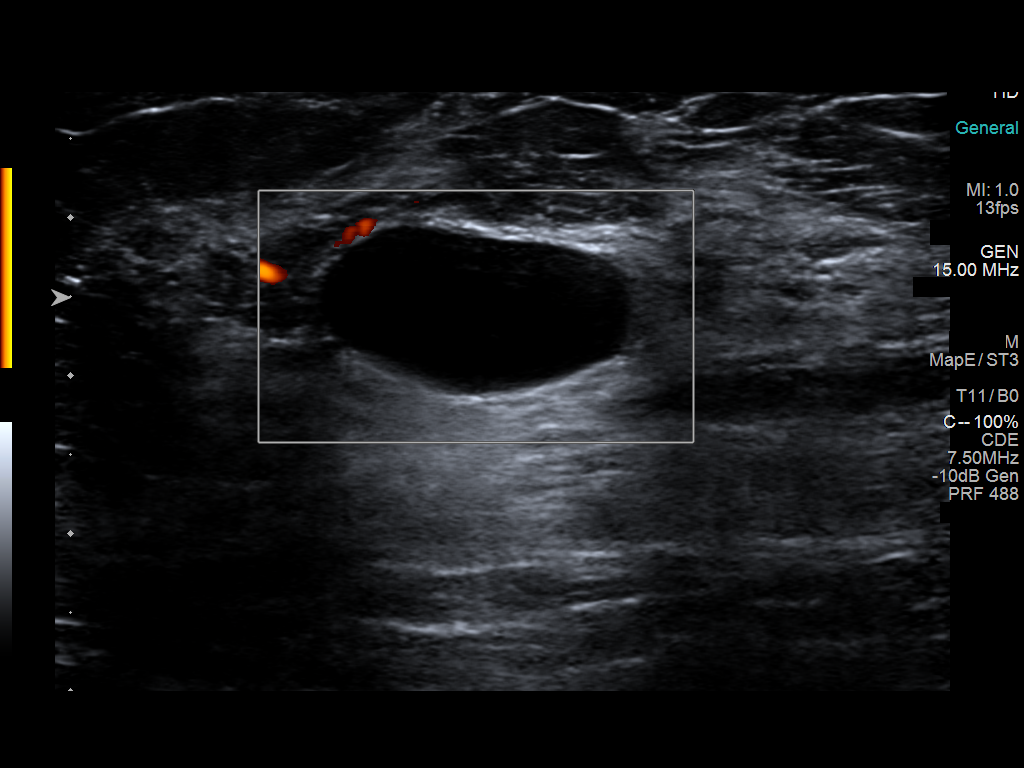

[5 of 5 positions shown; findings below may reference images not displayed]

ACR Breast Density Category c: The breast tissue is heterogeneously
dense, which may obscure small masses.
FINDINGS: 2D/3D full field and spot compression views of the RIGHT breast
demonstrate a persistent circumscribed oval mass within the UPPER
OUTER RIGHT breast.

Targeted ultrasound is performed, showing a 1.6 x 0.9 x 2 cm benign
simple cyst at the 10 o'clock position of the RIGHT breast 5 cm from
the nipple, corresponding to the screening study finding.
IMPRESSION: Benign cyst within the UPPER-OUTER RIGHT breast corresponding to the
screening study finding.

RECOMMENDATION:
Bilateral screening mammogram in 1 year.

I have discussed the findings and recommendations with the patient.
If applicable, a reminder letter will be sent to the patient
regarding the next appointment.

BI-RADS CATEGORY  2: Benign.

## 2024-09-04 ENCOUNTER — Other Ambulatory Visit: Payer: Self-pay | Admitting: Nurse Practitioner

## 2024-09-04 DIAGNOSIS — Z1231 Encounter for screening mammogram for malignant neoplasm of breast: Secondary | ICD-10-CM

## 2024-09-05 ENCOUNTER — Other Ambulatory Visit: Payer: Self-pay | Admitting: Nurse Practitioner

## 2024-09-05 DIAGNOSIS — N6310 Unspecified lump in the right breast, unspecified quadrant: Secondary | ICD-10-CM

## 2024-09-07 ENCOUNTER — Encounter: Payer: Self-pay | Admitting: Nurse Practitioner

## 2024-09-07 ENCOUNTER — Ambulatory Visit
Admission: RE | Admit: 2024-09-07 | Discharge: 2024-09-07 | Disposition: A | Discharge status: Home / Self Care | Source: Ambulatory Visit | Attending: Nurse Practitioner | Admitting: Nurse Practitioner

## 2024-09-07 ENCOUNTER — Inpatient Hospital Stay
Admission: RE | Admit: 2024-09-07 | Discharge: 2024-09-07 | Discharge status: Home / Self Care | Source: Ambulatory Visit | Attending: Nurse Practitioner

## 2024-09-07 DIAGNOSIS — N6310 Unspecified lump in the right breast, unspecified quadrant: Secondary | ICD-10-CM

## 2024-09-07 DIAGNOSIS — N6001 Solitary cyst of right breast: Secondary | ICD-10-CM

## 2024-11-23 ENCOUNTER — Ambulatory Visit: Admitting: Emergency Medicine

## 2024-11-23 ENCOUNTER — Encounter: Payer: Self-pay | Admitting: Emergency Medicine

## 2024-11-23 VITALS — BP 141/73 | HR 73 | Ht 66.0 in | Wt 153.0 lb

## 2024-11-23 DIAGNOSIS — F419 Anxiety disorder, unspecified: Secondary | ICD-10-CM

## 2024-11-23 DIAGNOSIS — F988 Other specified behavioral and emotional disorders with onset usually occurring in childhood and adolescence: Secondary | ICD-10-CM | POA: Diagnosis not present

## 2024-11-23 MED ORDER — LISDEXAMFETAMINE DIMESYLATE 30 MG PO CAPS: 30.0000 mg | ORAL_CAPSULE | Freq: Every day | ORAL | 0 refills | Status: AC

## 2024-11-23 MED ORDER — ALPRAZOLAM 0.25 MG PO TABS: ORAL_TABLET | ORAL | 0 refills | Status: DC

## 2024-11-23 NOTE — Progress Notes (Signed)
 [facility] MD/PA/NP Initial Note  11/23/2024 5:01 PM Kathryn  [name]  MRN:  [id number]  Chief Complaint:  Chief Complaint   Establish Care; ADHD    HPI:   Mrs. Kathryn  [name]  is a 43 yo F presenting to clinic for new patient psychiatric evaluation referral from her new PCP, Kathryn Agreste DO at [facility] [place] for ongoing ADHD medication management.  She started Adderall this summer, initially prescribed by her former PCP, Dr. Smitty at [facility] [place] for the past few years, but PCP relocated. Her current PCP continued a medication short-term (1 month supply) and referred her to psychiatry.   Current Psych Medication Regimen: Xanax .25 mg po daily - 1/2 - 1 tablet BID PRN for anxiety. Adderall ER 20 mg PRN  She reports only taking it during the week day  She reports significant improvement in cognitive functioning since starting Adderall last summer, including enhance concentration, decision-making, and memory.  She feels more organized, able to prioritize task effectively, and work more efficiently with less procrastination. She notes work performance has improved. Also, it has helped with completing household tasks. However, she notes some irritability and occasional difficulty sleeping, particularly waking at 2 to 3 AM due to racing thoughts. She is interested in possibly trying another ADHD medication.  Anxiety symptoms are better controlled with decreased worry and restlessness; no panic attacks reported. Denies significant depressive symptoms such as extreme sadness, tearfulness, or hopelessness. Energy and motivation are stable. Sleep is adequate and restful, but occasionally wakes up at 2-3 am due to racing thoughts. Appetite is stable, with normal weight and intact ADLs and personal hygiene. Ongoing symptom monitoring continues.   No engaged engaged in therapy. But, attends women's wellness group a few times a month, which she finds helpful.  Denies  mania, delirium, AVH, SI, HI, or self-harm behaviors. No further complaints at this time.   Visit Diagnosis:    ICD-10-CM   1. Attention deficit disorder, unspecified type  F98.8 lisdexamfetamine (VYVANSE) 30 MG capsule    2. Anxiety  F41.9      Past Psychiatric History:  No prior hospitalization  Past Psych Medication Trials: Adderall Lexapro Wellbutrin Propanolol    Past Medical History:  Past Medical History:  Diagnosis Date   Abnormal Pap smear    ASCUS +HPV   Migraines     Past Surgical History:  Procedure Laterality Date   COLPOSCOPY  2005   neg   KNEE SURGERY Left    LAPAROSCOPY N/A 04/21/2016   Procedure: LAPAROSCOPY DIAGNOSTIC;  Surgeon: Ronal GORMAN Pinal, MD;  Location: [facility];  Service: Gynecology;  Laterality: N/A;  Corky, J-plasma rep will be here per Ginnie in Dr.'s Office.  Confirmed on 04/17/16   LYSIS OF ADHESION  04/21/2016   Procedure: LYSIS OF ADHESION;  Surgeon: Ronal GORMAN Pinal, MD;  Location: [facility];  Service: Gynecology;;    Family Psychiatric History:   No FH of suicide, bipolar or schizophrenia   Family History:  Family History  Problem Relation Age of Onset   Cancer Mother        cervical   Hyperlipidemia Mother    Stroke Father    Other Father        pulmonary fibrosis   Social History:  Patient lives with fiance. She has 2 daughters, Kathryn [name] 35 yo and Kathryn [name] 45 yo who are both away for college.  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 Reports supportive social support system Denies tobacco, alcohol,  or substance use No history of legal issues or domestic violence reported.  Financial and housing stability are stable.  Social History   Socioeconomic History   Marital status: Legally Separated    Spouse name: Not on file   Number of children: Not on file   Years of education: Not on file   Highest education level: Not on file  Occupational History   Not on file  Tobacco Use   Smoking status: Never   Smokeless  tobacco: Never  Substance and Sexual Activity   Alcohol use: Yes    Alcohol/week: 2.0 standard drinks of alcohol    Types: 2 Standard drinks or equivalent per week   Drug use: No   Sexual activity: Yes    Partners: Male    Birth control/protection: Condom  Other Topics Concern   Not on file  Social History Narrative   Not on file   Social Drivers of Health   Financial Resource Strain: Low Risk  (11/23/2024)   Overall Financial Resource Strain (CARDIA)    Difficulty of Paying Living Expenses: Not very hard  Food Insecurity: No Food Insecurity (11/23/2024)   Hunger Vital Sign    Worried About Running Out of Food in the Last Year: Never true    Ran Out of Food in the Last Year: Never true  Transportation Needs: No Transportation Needs (11/23/2024)   PRAPARE - Administrator, Civil Service (Medical): No    Lack of Transportation (Non-Medical): No  Physical Activity: Not on file  Stress: Stress Concern Present (11/23/2024)   [employer] of Occupational Health - Occupational Stress Questionnaire    Feeling of Stress: To some extent  Social Connections: Not on file    Allergies: No Known Allergies  Metabolic Disorder Labs: No results found for: HGBA1C, MPG No results found for: PROLACTIN Lab Results  Component Value Date   CHOL 187 05/25/2017   TRIG 186 (H) 05/25/2017   HDL 58 05/25/2017   CHOLHDL 3.2 05/25/2017   LDLCALC 92 05/25/2017   Lab Results  Component Value Date   TSH 1.770 05/25/2017    Therapeutic Level Labs: No results found for: LITHIUM No results found for: VALPROATE No results found for: CBMZ  Current Medications: Current Outpatient Medications  Medication Sig Dispense Refill   acetaminophen  (TYLENOL ) 500 MG tablet Take 1,000 mg by mouth every 6 (six) hours as needed for headache. Reported on 05/13/2016     ALPRAZolam (XANAX) 0.25 MG tablet Take 0.25 mg by mouth 2 (two) times daily as needed for anxiety. (Patient taking  differently: Take 0.25 mg by mouth 2 (two) times daily as needed for anxiety.  1/2 tablet - 1 tablet BID PRN for anxiety.)     b complex vitamins tablet Take 1 tablet by mouth as needed. Reported on 04/20/2016     ibuprofen (ADVIL,MOTRIN) 200 MG tablet Take 200 mg by mouth.     lisdexamfetamine (VYVANSE) 30 MG capsule Take 1 capsule (30 mg total) by mouth daily. 30 capsule 0   Multiple Vitamins-Minerals (MULTIVITAMIN PO) Take by mouth daily. Reported on 05/13/2016     SUMAtriptan (IMITREX) 50 MG tablet Take 50 mg by mouth every 2 (two) hours as needed for migraine. Reported on 05/13/2016     norethindrone  (MICRONOR ,CAMILA ,ERRIN ) 0.35 MG tablet Take 1 tablet (0.35 mg total) by mouth daily. (Patient not taking: Reported on 11/23/2024) 3 Package 0   No current facility-administered medications for this visit.    Medication Side Effects: none  Orders placed this visit:  No orders of the defined types were placed in this encounter.  Psychiatric Specialty Exam:  Review of Systems  Psychiatric/Behavioral:         Please refer to HPI.  All other systems reviewed and are negative.   Blood pressure (!) 141/73, pulse 73, height 5' 6 (1.676 m), weight 153 lb (69.4 kg).Body mass index is 24.69 kg/m.  General Appearance: Neat and Well Groomed  Eye Contact:  Good  Speech:  Normal Rate  Volume:  Normal  Mood:  Anxious  Affect:  Appropriate  Thought Process:  Coherent, Goal Directed, and Linear  Orientation:  Full (Time, Place, and Person)  Thought Content: WDL   Suicidal Thoughts:  No  Homicidal Thoughts:  No  Memory:  WNL  Judgement:  Good  Insight:  Good  Psychomotor Activity:  Normal  Concentration:  Concentration: Good  Recall:  Good  Fund of Knowledge: Good  Language: Good  Assets:  Communication Skills Desire for Improvement Financial Resources/Insurance Social Support Curator Vocational/Educational  ADL's:  Intact  Cognition: WNL  Prognosis:  Good    Screenings:   Receiving Psychotherapy: No   Treatment Plan/Recommendations:  I provided approximately 60 minutes of face to face time during this encounter, including time spent before and after the visit in records review, medical decision making, counseling pertinent to today's visit, and charting.   Discussed dx and tx plan. Discussed alternative options including therapy.   PDMP reviewed: low risk trend  LF Xanax on 11/17 LF: Adderall on 11/17   ADD - controlled Initiate Vyvanse 30 mg po once daily AM  Will consider increase to 40 po once daily AM If partial response tolerable; further titrate vyvanse by 10 to 20 mg weekly as needed up to 70 mg/day. Or will restart Adderall ER 20 mg po daily Will try long-acting to minimize anxiety Monitor SE  including BP, HR, sleep, appetite, anxiety, GI upset  Discussed organizational strategies planners, time management, and mindfulness techniques to support ADHD symptoms  Anxiety - controlled Continue Xanax .25 mg po daily - 1/2 - 1 tablet BID PRN for anxiety. Last refill: Xanax on 11/17  FOLLOW UP: 4 weeks or sooner if clinically indicated.  Risks, benefits, and alternatives of the medications and treatment plan prescribed today were discussed. Instructed patient to contact office or go to ED if experiencing any significant tolerability issues. Patient engaged in shared decision-making;treatment plan reviewed and agreed upon.     Ameli Sangiovanni, PA-C

## 2024-12-19 ENCOUNTER — Ambulatory Visit: Admitting: Emergency Medicine

## 2025-01-08 ENCOUNTER — Encounter: Payer: Self-pay | Admitting: Emergency Medicine

## 2025-01-08 ENCOUNTER — Ambulatory Visit: Admitting: Emergency Medicine

## 2025-01-08 DIAGNOSIS — F988 Other specified behavioral and emotional disorders with onset usually occurring in childhood and adolescence: Secondary | ICD-10-CM

## 2025-01-08 DIAGNOSIS — F419 Anxiety disorder, unspecified: Secondary | ICD-10-CM

## 2025-01-08 MED ORDER — AMPHETAMINE-DEXTROAMPHET ER 20 MG PO CP24: 20.0000 mg | ORAL_CAPSULE | Freq: Every day | ORAL | 0 refills | Status: AC

## 2025-01-08 MED ORDER — ALPRAZOLAM 0.25 MG PO TABS: ORAL_TABLET | ORAL | 0 refills | Status: AC

## 2025-01-08 NOTE — Progress Notes (Signed)
 Kathryn  ZYIA [name] [id number] Aug 03, 1981 44 y.o.  Subjective:   Patient ID:  Kathryn  P [name] is a 44 y.o. (DOB 1981/12/10) female.  Chief Complaint:  Chief Complaint  Patient presents with   Follow-up   ADD   Anxiety    HPI Kathryn  P [name] presents to the office today for follow-up for routine medication management  Current Psych Medication Regimen: Vyvanse  30  mg po daily Xanax  .25 mg po daily - 1/2 - 1 tablet BID PRN for anxiety.  Since LOV, she trialed Vyvanse  30 mg by mouth daily consistently for 2 weeks but developed HA and found it less effective for ADD symptoms compared to her prior regimen. She took a brief break for 1 week and restarted Vyvanse , but still experienced HAs and poor response persisted, leading her to resume Adderall XR 20 mg daily (she had previous rx, which was effective for 1 year). She noted improvement in focus, concentrations, and energy. She was able to complete tasks and partner noticed positive response. She will like to resume this medication regimen.  Anxiety symptoms remain well-controlled on Xanax  0.25 mg (-1 tablet twice daily as needed), with only 5 uses in the past month and no reported panic attacks. Sleep is now adequate and restful, appetite is stable with normal weight maintained, and ADLs/personal hygiene are fully intact. Ongoing symptom monitoring continues.  Denies mania, delirium, AVH, SI, HI, or self-harm behaviors. No further complaints at this time.   Review of Systems:  Review of Systems  Psychiatric/Behavioral:         Please refer to HPI.  All other systems reviewed and are negative.  Past medications for mental health diagnoses include: Adderall Lexapro Wellbutrin Propanolol    Medications: I have reviewed the patient's current medications.  Current Outpatient Medications  Medication Sig Dispense Refill   acetaminophen  (TYLENOL ) 500 MG tablet Take 1,000 mg by mouth every 6 (six) hours as needed for headache. Reported  on 05/13/2016     ALPRAZolam  (XANAX ) 0.25 MG tablet Take 1/2 - 1 tablet BID PRN for anxiety. 30 tablet 0   b complex vitamins tablet Take 1 tablet by mouth as needed. Reported on 04/20/2016     ibuprofen (ADVIL,MOTRIN) 200 MG tablet Take 200 mg by mouth.     lisdexamfetamine  (VYVANSE ) 30 MG capsule Take 1 capsule (30 mg total) by mouth daily. 30 capsule 0   Multiple Vitamins-Minerals (MULTIVITAMIN PO) Take by mouth daily. Reported on 05/13/2016     norethindrone  (MICRONOR ,CAMILA ,ERRIN ) 0.35 MG tablet Take 1 tablet (0.35 mg total) by mouth daily. (Patient not taking: Reported on 11/23/2024) 3 Package 0   SUMAtriptan (IMITREX) 50 MG tablet Take 50 mg by mouth every 2 (two) hours as needed for migraine. Reported on 05/13/2016     No current facility-administered medications for this visit.    Medication Side Effects: None  Allergies: Allergies[1]  Past Medical History:  Diagnosis Date   Abnormal Pap smear    ASCUS +HPV   Migraines     Past Medical History, Surgical history, Social history, and Family history were reviewed and updated as appropriate.   Please see review of systems for further details on the patient's review from today.   Objective:   Physical Exam:  There were no vitals taken for this visit.  Physical Exam Psychiatric:        Attention and Perception: Attention and perception normal.        Mood and Affect: Mood and affect normal.  Speech: Speech normal.        Behavior: Behavior normal. Behavior is cooperative.        Thought Content: Thought content normal.        Cognition and Memory: Cognition and memory normal.        Judgment: Judgment normal.     Lab Review:     Component Value Date/Time   NA 140 05/25/2017 1522   K 4.4 05/25/2017 1522   CL 100 05/25/2017 1522   CO2 26 05/25/2017 1522   GLUCOSE 88 05/25/2017 1522   BUN 17 05/25/2017 1522   CREATININE 0.76 05/25/2017 1522   CALCIUM 9.2 05/25/2017 1522   PROT 6.8 05/25/2017 1522   ALBUMIN 4.4  05/25/2017 1522   AST 15 05/25/2017 1522   ALT 13 05/25/2017 1522   ALKPHOS 44 05/25/2017 1522   BILITOT 0.4 05/25/2017 1522   GFRNONAA 101 05/25/2017 1522   GFRAA 116 05/25/2017 1522       Component Value Date/Time   WBC 5.3 04/20/2016 1041   RBC 4.27 04/20/2016 1041   HGB 12.8 04/20/2016 1041   HGB 13.6 08/03/2013 1118   HCT 37.5 04/20/2016 1041   PLT 238 04/20/2016 1041   MCV 87.8 04/20/2016 1041   MCH 30.0 04/20/2016 1041   MCHC 34.1 04/20/2016 1041   RDW 13.2 04/20/2016 1041   LYMPHSABS 2.7 03/17/2016 1357   MONOABS 0.6 03/17/2016 1357   EOSABS 0.3 03/17/2016 1357   BASOSABS 0.0 03/17/2016 1357    No results found for: POCLITH, LITHIUM   No results found for: PHENYTOIN, PHENOBARB, VALPROATE, CBMZ   .res Assessment: Plan:    Kathryn [name]  was seen today for follow-up, add and anxiety.  Diagnoses and all orders for this visit:  Attention deficit disorder, unspecified type  Anxiety     Please see After Visit Summary for patient specific instructions.  Future Appointments  Date Time Provider Department Center  01/08/2025  9:30 AM Florina Friends, PA-C [facility] None    No orders of the defined types were placed in this encounter.   I provided approximately 30 minutes of face to face time during this encounter, including time spent before and after the visit in records review, medical decision making, counseling pertinent to today's visit, and charting.   Discussed dx and tx plan. Discussed alternative options including therapy.   PDMP reviewed: low risk trend  LF Xanax  on 12/04 LF: Adderall on 12/05    ADD - controlled Discontinue Vyvanse  30 mg due to SE (headaches) and ineffectiveness Resume Adderall XR 20 mg po PRN  Refilled Advised to keep a medication SE  journal to systematically track any symptoms, noting onset, frequency, severity, and context of side effects. Monitor SE including BP, HR, sleep, appetite, anxiety, GI upset  Discussed  organizational strategies planners, time management, and mindfulness techniques to support ADHD symptoms  Anxiety - controlled Continue Xanax  .25 mg po daily - 1/2 - 1 tablet BID PRN for anxiety. Last refill: Xanax  on 11/17 Refilled    FOLLOW UP: 4 weeks or sooner if clinically indicated.  Risks, benefits, and alternatives of the medications and treatment plan prescribed today were discussed. Patient engaged in shared decision-making;treatment plan reviewed and agreed upon.    Zenora Karpel PA-C, DMSc     [1] No Known Allergies

## 2025-02-06 ENCOUNTER — Ambulatory Visit: Admitting: Emergency Medicine
